# Patient Record
Sex: Male | Born: 1935 | Race: White | Hispanic: No | Smoking: Never smoker
Health system: Southern US, Community
[De-identification: ages and names within clinical notes are randomized; demographics above are authoritative.]

## PROBLEM LIST (undated history)

## (undated) DIAGNOSIS — E785 Hyperlipidemia, unspecified: Secondary | ICD-10-CM

## (undated) DIAGNOSIS — G709 Myoneural disorder, unspecified: Secondary | ICD-10-CM

## (undated) DIAGNOSIS — M1611 Unilateral primary osteoarthritis, right hip: Secondary | ICD-10-CM

## (undated) DIAGNOSIS — I10 Essential (primary) hypertension: Secondary | ICD-10-CM

---

## 2007-08-08 HISTORY — PX: JOINT REPLACEMENT: SHX530

## 2013-09-15 DIAGNOSIS — L819 Disorder of pigmentation, unspecified: Secondary | ICD-10-CM | POA: Diagnosis not present

## 2013-09-15 DIAGNOSIS — L570 Actinic keratosis: Secondary | ICD-10-CM | POA: Diagnosis not present

## 2013-09-15 DIAGNOSIS — L821 Other seborrheic keratosis: Secondary | ICD-10-CM | POA: Diagnosis not present

## 2013-09-15 DIAGNOSIS — I789 Disease of capillaries, unspecified: Secondary | ICD-10-CM | POA: Diagnosis not present

## 2014-06-01 DIAGNOSIS — E782 Mixed hyperlipidemia: Secondary | ICD-10-CM | POA: Diagnosis not present

## 2014-06-01 DIAGNOSIS — Z23 Encounter for immunization: Secondary | ICD-10-CM | POA: Diagnosis not present

## 2014-06-01 DIAGNOSIS — I10 Essential (primary) hypertension: Secondary | ICD-10-CM | POA: Diagnosis not present

## 2014-07-06 DIAGNOSIS — I10 Essential (primary) hypertension: Secondary | ICD-10-CM | POA: Diagnosis not present

## 2014-07-06 DIAGNOSIS — Z0000 Encounter for general adult medical examination without abnormal findings: Secondary | ICD-10-CM | POA: Diagnosis not present

## 2014-12-24 DIAGNOSIS — I10 Essential (primary) hypertension: Secondary | ICD-10-CM | POA: Diagnosis not present

## 2014-12-24 DIAGNOSIS — E782 Mixed hyperlipidemia: Secondary | ICD-10-CM | POA: Diagnosis not present

## 2015-07-05 DIAGNOSIS — D229 Melanocytic nevi, unspecified: Secondary | ICD-10-CM | POA: Diagnosis not present

## 2015-07-05 DIAGNOSIS — L309 Dermatitis, unspecified: Secondary | ICD-10-CM | POA: Diagnosis not present

## 2015-07-05 DIAGNOSIS — Z79899 Other long term (current) drug therapy: Secondary | ICD-10-CM | POA: Diagnosis not present

## 2015-07-05 DIAGNOSIS — I10 Essential (primary) hypertension: Secondary | ICD-10-CM | POA: Diagnosis not present

## 2015-07-05 DIAGNOSIS — Z23 Encounter for immunization: Secondary | ICD-10-CM | POA: Diagnosis not present

## 2015-07-05 DIAGNOSIS — E782 Mixed hyperlipidemia: Secondary | ICD-10-CM | POA: Diagnosis not present

## 2015-12-13 DIAGNOSIS — Z23 Encounter for immunization: Secondary | ICD-10-CM | POA: Diagnosis not present

## 2015-12-13 DIAGNOSIS — L309 Dermatitis, unspecified: Secondary | ICD-10-CM | POA: Diagnosis not present

## 2015-12-13 DIAGNOSIS — Z125 Encounter for screening for malignant neoplasm of prostate: Secondary | ICD-10-CM | POA: Diagnosis not present

## 2015-12-13 DIAGNOSIS — I10 Essential (primary) hypertension: Secondary | ICD-10-CM | POA: Diagnosis not present

## 2015-12-13 DIAGNOSIS — E782 Mixed hyperlipidemia: Secondary | ICD-10-CM | POA: Diagnosis not present

## 2015-12-13 DIAGNOSIS — Z0000 Encounter for general adult medical examination without abnormal findings: Secondary | ICD-10-CM | POA: Diagnosis not present

## 2015-12-13 DIAGNOSIS — Z1211 Encounter for screening for malignant neoplasm of colon: Secondary | ICD-10-CM | POA: Diagnosis not present

## 2016-06-26 DIAGNOSIS — Z79899 Other long term (current) drug therapy: Secondary | ICD-10-CM | POA: Diagnosis not present

## 2016-06-26 DIAGNOSIS — Z6832 Body mass index (BMI) 32.0-32.9, adult: Secondary | ICD-10-CM | POA: Diagnosis not present

## 2016-06-26 DIAGNOSIS — E782 Mixed hyperlipidemia: Secondary | ICD-10-CM | POA: Diagnosis not present

## 2016-06-26 DIAGNOSIS — K429 Umbilical hernia without obstruction or gangrene: Secondary | ICD-10-CM | POA: Diagnosis not present

## 2016-06-26 DIAGNOSIS — Z23 Encounter for immunization: Secondary | ICD-10-CM | POA: Diagnosis not present

## 2016-06-26 DIAGNOSIS — I10 Essential (primary) hypertension: Secondary | ICD-10-CM | POA: Diagnosis not present

## 2017-01-08 DIAGNOSIS — Z6832 Body mass index (BMI) 32.0-32.9, adult: Secondary | ICD-10-CM | POA: Diagnosis not present

## 2017-01-08 DIAGNOSIS — Z0000 Encounter for general adult medical examination without abnormal findings: Secondary | ICD-10-CM | POA: Diagnosis not present

## 2017-01-08 DIAGNOSIS — Z125 Encounter for screening for malignant neoplasm of prostate: Secondary | ICD-10-CM | POA: Diagnosis not present

## 2017-01-08 DIAGNOSIS — Z23 Encounter for immunization: Secondary | ICD-10-CM | POA: Diagnosis not present

## 2017-05-30 DIAGNOSIS — C4491 Basal cell carcinoma of skin, unspecified: Secondary | ICD-10-CM | POA: Diagnosis not present

## 2017-05-30 DIAGNOSIS — Z125 Encounter for screening for malignant neoplasm of prostate: Secondary | ICD-10-CM | POA: Diagnosis not present

## 2017-05-30 DIAGNOSIS — Z23 Encounter for immunization: Secondary | ICD-10-CM | POA: Diagnosis not present

## 2017-05-30 DIAGNOSIS — R609 Edema, unspecified: Secondary | ICD-10-CM | POA: Diagnosis not present

## 2017-05-30 DIAGNOSIS — I10 Essential (primary) hypertension: Secondary | ICD-10-CM | POA: Diagnosis not present

## 2017-05-30 DIAGNOSIS — Z79899 Other long term (current) drug therapy: Secondary | ICD-10-CM | POA: Diagnosis not present

## 2017-05-30 DIAGNOSIS — E782 Mixed hyperlipidemia: Secondary | ICD-10-CM | POA: Diagnosis not present

## 2017-07-02 DIAGNOSIS — M19049 Primary osteoarthritis, unspecified hand: Secondary | ICD-10-CM | POA: Diagnosis not present

## 2017-07-02 DIAGNOSIS — M5412 Radiculopathy, cervical region: Secondary | ICD-10-CM | POA: Diagnosis not present

## 2017-07-02 DIAGNOSIS — R609 Edema, unspecified: Secondary | ICD-10-CM | POA: Diagnosis not present

## 2017-07-03 ENCOUNTER — Other Ambulatory Visit: Payer: Self-pay | Admitting: Family Medicine

## 2017-07-03 ENCOUNTER — Ambulatory Visit
Admission: RE | Admit: 2017-07-03 | Discharge: 2017-07-03 | Disposition: A | Discharge status: Home / Self Care | Payer: Medicare Other | Source: Ambulatory Visit | Attending: Family Medicine | Admitting: Family Medicine

## 2017-07-03 DIAGNOSIS — M1990 Unspecified osteoarthritis, unspecified site: Secondary | ICD-10-CM

## 2017-07-03 DIAGNOSIS — M542 Cervicalgia: Secondary | ICD-10-CM | POA: Diagnosis not present

## 2017-07-03 DIAGNOSIS — M19041 Primary osteoarthritis, right hand: Secondary | ICD-10-CM | POA: Diagnosis not present

## 2017-07-03 DIAGNOSIS — M19042 Primary osteoarthritis, left hand: Secondary | ICD-10-CM | POA: Diagnosis not present

## 2017-07-09 DIAGNOSIS — Z23 Encounter for immunization: Secondary | ICD-10-CM | POA: Diagnosis not present

## 2017-07-09 DIAGNOSIS — L821 Other seborrheic keratosis: Secondary | ICD-10-CM | POA: Diagnosis not present

## 2017-07-09 DIAGNOSIS — L570 Actinic keratosis: Secondary | ICD-10-CM | POA: Diagnosis not present

## 2017-07-09 DIAGNOSIS — D485 Neoplasm of uncertain behavior of skin: Secondary | ICD-10-CM | POA: Diagnosis not present

## 2017-07-09 DIAGNOSIS — D1800 Hemangioma unspecified site: Secondary | ICD-10-CM | POA: Diagnosis not present

## 2017-07-13 ENCOUNTER — Ambulatory Visit
Admission: RE | Admit: 2017-07-13 | Discharge: 2017-07-13 | Disposition: A | Discharge status: Home / Self Care | Payer: Medicare Other | Source: Ambulatory Visit | Attending: Family Medicine | Admitting: Family Medicine

## 2017-07-13 DIAGNOSIS — M1990 Unspecified osteoarthritis, unspecified site: Secondary | ICD-10-CM

## 2017-07-13 DIAGNOSIS — M50221 Other cervical disc displacement at C4-C5 level: Secondary | ICD-10-CM | POA: Diagnosis not present

## 2017-07-13 DIAGNOSIS — M5023 Other cervical disc displacement, cervicothoracic region: Secondary | ICD-10-CM | POA: Diagnosis not present

## 2017-07-13 MED ORDER — GADOBENATE DIMEGLUMINE 529 MG/ML IV SOLN
20.0000 mL | Freq: Once | INTRAVENOUS | Status: AC | PRN
  Administered 2017-07-13: 20 mL via INTRAVENOUS

## 2017-07-17 DIAGNOSIS — M5412 Radiculopathy, cervical region: Secondary | ICD-10-CM | POA: Diagnosis not present

## 2017-07-17 DIAGNOSIS — E782 Mixed hyperlipidemia: Secondary | ICD-10-CM | POA: Diagnosis not present

## 2017-07-17 DIAGNOSIS — L309 Dermatitis, unspecified: Secondary | ICD-10-CM | POA: Diagnosis not present

## 2017-07-17 DIAGNOSIS — Z6832 Body mass index (BMI) 32.0-32.9, adult: Secondary | ICD-10-CM | POA: Diagnosis not present

## 2017-08-14 DIAGNOSIS — G5603 Carpal tunnel syndrome, bilateral upper limbs: Secondary | ICD-10-CM | POA: Diagnosis not present

## 2017-08-27 DIAGNOSIS — Z6828 Body mass index (BMI) 28.0-28.9, adult: Secondary | ICD-10-CM | POA: Diagnosis not present

## 2017-08-27 DIAGNOSIS — H6123 Impacted cerumen, bilateral: Secondary | ICD-10-CM | POA: Diagnosis not present

## 2017-08-27 DIAGNOSIS — E782 Mixed hyperlipidemia: Secondary | ICD-10-CM | POA: Diagnosis not present

## 2017-08-28 DIAGNOSIS — G5603 Carpal tunnel syndrome, bilateral upper limbs: Secondary | ICD-10-CM | POA: Diagnosis not present

## 2017-09-03 DIAGNOSIS — L817 Pigmented purpuric dermatosis: Secondary | ICD-10-CM | POA: Diagnosis not present

## 2017-09-03 DIAGNOSIS — L570 Actinic keratosis: Secondary | ICD-10-CM | POA: Diagnosis not present

## 2017-09-11 ENCOUNTER — Encounter
Admission: RE | Admit: 2017-09-11 | Discharge: 2017-09-11 | Disposition: A | Discharge status: Home / Self Care | Payer: Medicare Other | Source: Ambulatory Visit | Attending: Neurosurgery | Admitting: Neurosurgery

## 2017-09-11 ENCOUNTER — Other Ambulatory Visit: Payer: Self-pay

## 2017-09-11 ENCOUNTER — Ambulatory Visit
Admission: RE | Admit: 2017-09-11 | Discharge: 2017-09-11 | Disposition: A | Discharge status: Home / Self Care | Payer: Medicare Other | Source: Ambulatory Visit | Attending: Neurosurgery | Admitting: Neurosurgery

## 2017-09-11 DIAGNOSIS — I10 Essential (primary) hypertension: Secondary | ICD-10-CM | POA: Insufficient documentation

## 2017-09-11 DIAGNOSIS — Z01812 Encounter for preprocedural laboratory examination: Secondary | ICD-10-CM | POA: Insufficient documentation

## 2017-09-11 DIAGNOSIS — Z01818 Encounter for other preprocedural examination: Secondary | ICD-10-CM | POA: Diagnosis not present

## 2017-09-11 DIAGNOSIS — Z01810 Encounter for preprocedural cardiovascular examination: Secondary | ICD-10-CM | POA: Diagnosis not present

## 2017-09-11 DIAGNOSIS — Z419 Encounter for procedure for purposes other than remedying health state, unspecified: Secondary | ICD-10-CM

## 2017-09-11 HISTORY — DX: Myoneural disorder, unspecified: G70.9

## 2017-09-11 HISTORY — DX: Essential (primary) hypertension: I10

## 2017-09-11 HISTORY — DX: Hyperlipidemia, unspecified: E78.5

## 2017-09-11 LAB — CBC
HEMATOCRIT: 45 % (ref 40.0–52.0)
Hemoglobin: 15.2 g/dL (ref 13.0–18.0)
MCH: 31.7 pg (ref 26.0–34.0)
MCHC: 33.8 g/dL (ref 32.0–36.0)
MCV: 93.6 fL (ref 80.0–100.0)
Platelets: 162 10*3/uL (ref 150–440)
RBC: 4.8 MIL/uL (ref 4.40–5.90)
RDW: 13.5 % (ref 11.5–14.5)
WBC: 4.2 10*3/uL (ref 3.8–10.6)

## 2017-09-11 LAB — URINALYSIS, ROUTINE W REFLEX MICROSCOPIC
Bilirubin Urine: NEGATIVE
Glucose, UA: NEGATIVE mg/dL
HGB URINE DIPSTICK: NEGATIVE
Ketones, ur: NEGATIVE mg/dL
Leukocytes, UA: NEGATIVE
NITRITE: NEGATIVE
Protein, ur: NEGATIVE mg/dL
SPECIFIC GRAVITY, URINE: 1.017 (ref 1.005–1.030)
pH: 5 (ref 5.0–8.0)

## 2017-09-11 LAB — BASIC METABOLIC PANEL
Anion gap: 7 (ref 5–15)
BUN: 25 mg/dL — AB (ref 6–20)
CO2: 25 mmol/L (ref 22–32)
Calcium: 9.1 mg/dL (ref 8.9–10.3)
Chloride: 106 mmol/L (ref 101–111)
Creatinine, Ser: 0.85 mg/dL (ref 0.61–1.24)
GFR calc Af Amer: >60 mL/min (ref >60)
GLUCOSE: 122 mg/dL — AB (ref 65–99)
POTASSIUM: 3.9 mmol/L (ref 3.5–5.1)
Sodium: 138 mmol/L (ref 135–145)

## 2017-09-11 LAB — DIFFERENTIAL
BASOS PCT: 1 %
Basophils Absolute: 0 10*3/uL (ref 0–0.1)
EOS PCT: 3 %
Eosinophils Absolute: 0.1 10*3/uL (ref 0–0.7)
Lymphocytes Relative: 29 %
Lymphs Abs: 1.2 10*3/uL (ref 1.0–3.6)
MONO ABS: 0.6 10*3/uL (ref 0.2–1.0)
MONOS PCT: 15 %
NEUTROS ABS: 2.2 10*3/uL (ref 1.4–6.5)
Neutrophils Relative %: 52 %

## 2017-09-11 LAB — PROTIME-INR
INR: 1.04
Prothrombin Time: 13.5 seconds (ref 11.4–15.2)

## 2017-09-11 LAB — APTT: aPTT: 30 seconds (ref 24–36)

## 2017-09-11 NOTE — Patient Instructions (Signed)
Your procedure is scheduled on: Monday 3/35/19 Report to Day Surgery. To find out your arrival time please call [phone] between 1PM - 3PM on Friday. 10/26/17  Remember: Instructions that are not followed completely may result in serious medical risk, up to and including death, or upon the discretion of your surgeon and anesthesiologist your surgery may need to be rescheduled.     _X__ 1. Do not eat food after midnight the night before your procedure.                 No gum chewing or hard candies. You may drink clear liquids up to 2 hours                 before you are scheduled to arrive for your surgery- DO not drink clear                 liquids within 2 hours of the start of your surgery.                 Clear Liquids include:  water, apple juice without pulp, clear carbohydrate                 drink such as Clearfast of Gartorade, Black Coffee or Tea (Do not add                 anything to coffee or tea).  __X__2.  On the morning of surgery brush your teeth with toothpaste and water, you  may rinse your mouth with mouthwash if you wish.  Do not swallow any toothpaste of mouthwash.     _X__ 3.  No Alcohol for 24 hours before or after surgery.   _X__ 4.  Do Not Smoke or use e-cigarettes For 24 Hours Prior to Your Surgery.                 Do not use any chewable tobacco products for at least 6 hours prior to                 surgery.  ____  5.  Bring all medications with you on the day of surgery if instructed.   ____  6.  Notify your doctor if there is any change in your medical condition      (cold, fever, infections).     Do not wear jewelry, make-up, hairpins, clips or nail polish. Do not wear lotions, powders, or perfumes. You may wear deodorant. Do not shave 48 hours prior to surgery. Men may shave face and neck. Do not bring valuables to the hospital.    [facility] is not responsible for any belongings or valuables.  Contacts, dentures or  bridgework may not be worn into surgery. Leave your suitcase in the car. After surgery it may be brought to your room. For patients admitted to the hospital, discharge time is determined by your treatment team.   Patients discharged the day of surgery will not be allowed to drive home.   Please read over the following fact sheets that you were given:    _x___ Take these medicines the morning of surgery with A SIP OF WATER:    1.amLODipine (NORVASC) 10 MG tablet  2. atorvastatin (LIPITOR) 20 MG tablet  3.   4.  5.  6.  ____ Fleet Enema (as directed)   x____ Use CHG Soap as directed  ____ Use inhalers on the day of surgery  ____ Stop metformin 2 days prior to  surgery    ____ Take 1/2 of usual insulin dose the night before surgery. No insulin the morning          of surgery.   __x__ Stop aspirin on 10/22/17   __x__ Stop Anti-inflammatories on naproxen sodium (ALEVE) 220 MG tablet, diclofenac sodium (VOLTAREN) 1 % GEL on 10/22/17   __x__ Stop supplements until after surgery.  Everflex, Coryza forte otc supplement, Fenshi otc supplement on 10/22/17  ____ Bring C-Pap to the hospital.

## 2017-09-11 NOTE — Patient Instructions (Signed)
Your procedure is scheduled QM:GQQPYP 09/17/17 Report to Day Surgery. To find out your arrival time please call [phone] between 1PM - 3PM on Friday.09/24/17  Remember: Instructions that are not followed completely may result in serious medical risk, up to and including death, or upon the discretion of your surgeon and anesthesiologist your surgery may need to be rescheduled.     _X__ 1. Do not eat food after midnight the night before your procedure.                 No gum chewing or hard candies. You may drink clear liquids up to 2 hours                 before you are scheduled to arrive for your surgery- DO not drink clear                 liquids within 2 hours of the start of your surgery.                 Clear Liquids include:  water, apple juice without pulp, clear carbohydrate                 drink such as Clearfast of Gartorade, Black Coffee or Tea (Do not add                 anything to coffee or tea).  __X__2.  On the morning of surgery brush your teeth with toothpaste and water, you  may rinse your mouth with mouthwash if you wish.  Do not swallow any              toothpaste of mouthwash.     _X__ 3.  No Alcohol for 24 hours before or after surgery.   ___ 4.  Do Not Smoke or use e-cigarettes For 24 Hours Prior to Your Surgery.                 Do not use any chewable tobacco products for at least 6 hours prior to                 surgery.  ____  5.  Bring all medications with you on the day of surgery if instructed.   _x___  6.  Notify your doctor if there is any change in your medical condition      (cold, fever, infections).     Do not wear jewelry, make-up, hairpins, clips or nail polish. Do not wear lotions, powders, or perfumes. You may wear deodorant. Do not shave 48 hours prior to surgery. Men may shave face and neck. Do not bring valuables to the hospital.    [facility] is not responsible for any belongings or valuables.  Contacts,  dentures or bridgework may not be worn into surgery. Leave your suitcase in the car. After surgery it may be brought to your room. For patients admitted to the hospital, discharge time is determined by your treatment team.   Patients discharged the day of surgery will not be allowed to drive home.   Please read over the following fact sheets that you were given:    _x___ Take these medicines the morning of surgery with A SIP OF WATER:    1. amLODipine (NORVASC) 10 MG tablet  2. atorvastatin (LIPITOR) 20 MG tablet  3.   4.  5.  6.  ____ Fleet Enema (as directed)   __x__ Use CHG Soap as directed  ____ Use inhalers on  the day of surgery  ____ Stop metformin 2 days prior to surgery    ____ Take 1/2 of usual insulin dose the night before surgery. No insulin the morning          of surgery.   __x__ Stopped aspirin on 2/3  __x__ Stop Anti-inflammatories on on 2/3  diclofenac sodium (VOLTAREN) 1 % GEL,naproxen sodium (ALEVE) 220 MG tablet   __x__ Stop supplements until after surgery.  everflex,Coryza ,Fenshi otc supplement  ____ Bring C-Pap to the hospital.

## 2017-09-17 ENCOUNTER — Ambulatory Visit: Payer: Medicare Other | Admitting: Anesthesiology

## 2017-09-17 ENCOUNTER — Encounter
Admission: RE | Disposition: A | Discharge status: Home / Self Care | Payer: Self-pay | Source: Ambulatory Visit | Attending: Neurosurgery

## 2017-09-17 ENCOUNTER — Ambulatory Visit
Admission: RE | Admit: 2017-09-17 | Discharge: 2017-09-17 | Disposition: A | Discharge status: Home / Self Care | Payer: Medicare Other | Source: Ambulatory Visit | Attending: Neurosurgery | Admitting: Neurosurgery

## 2017-09-17 DIAGNOSIS — Z7982 Long term (current) use of aspirin: Secondary | ICD-10-CM | POA: Diagnosis not present

## 2017-09-17 DIAGNOSIS — Z9660 Presence of unspecified orthopedic joint implant: Secondary | ICD-10-CM | POA: Diagnosis not present

## 2017-09-17 DIAGNOSIS — Z79899 Other long term (current) drug therapy: Secondary | ICD-10-CM | POA: Diagnosis not present

## 2017-09-17 DIAGNOSIS — G5603 Carpal tunnel syndrome, bilateral upper limbs: Secondary | ICD-10-CM | POA: Insufficient documentation

## 2017-09-17 DIAGNOSIS — R200 Anesthesia of skin: Secondary | ICD-10-CM | POA: Diagnosis present

## 2017-09-17 DIAGNOSIS — I10 Essential (primary) hypertension: Secondary | ICD-10-CM | POA: Diagnosis not present

## 2017-09-17 DIAGNOSIS — E785 Hyperlipidemia, unspecified: Secondary | ICD-10-CM | POA: Diagnosis not present

## 2017-09-17 DIAGNOSIS — G5601 Carpal tunnel syndrome, right upper limb: Secondary | ICD-10-CM | POA: Diagnosis not present

## 2017-09-17 HISTORY — PX: CARPAL TUNNEL RELEASE: SHX101

## 2017-09-17 SURGERY — CARPAL TUNNEL RELEASE
Anesthesia: Monitor Anesthesia Care | Site: Wrist | Laterality: Right | Wound class: Clean

## 2017-09-17 MED ORDER — PROPOFOL 10 MG/ML IV BOLUS
INTRAVENOUS | Status: AC
  Filled 2017-09-17: qty 20

## 2017-09-17 MED ORDER — FENTANYL CITRATE (PF) 100 MCG/2ML IJ SOLN: 25.0000 ug | INTRAMUSCULAR | Status: DC | PRN

## 2017-09-17 MED ORDER — LIDOCAINE HCL (CARDIAC) 20 MG/ML IV SOLN
INTRAVENOUS | Status: DC | PRN
  Administered 2017-09-17: 100 mg via INTRAVENOUS

## 2017-09-17 MED ORDER — FENTANYL CITRATE (PF) 100 MCG/2ML IJ SOLN
INTRAMUSCULAR | Status: AC
  Filled 2017-09-17: qty 2

## 2017-09-17 MED ORDER — PROPOFOL 500 MG/50ML IV EMUL
INTRAVENOUS | Status: DC | PRN
  Administered 2017-09-17: 50 ug/kg/min via INTRAVENOUS

## 2017-09-17 MED ORDER — FAMOTIDINE 20 MG PO TABS
20.0000 mg | ORAL_TABLET | Freq: Once | ORAL | Status: AC
  Administered 2017-09-17: 20 mg via ORAL

## 2017-09-17 MED ORDER — FAMOTIDINE 20 MG PO TABS
ORAL_TABLET | ORAL | Status: AC
  Filled 2017-09-17: qty 1

## 2017-09-17 MED ORDER — LACTATED RINGERS IV SOLN
INTRAVENOUS | Status: DC
  Administered 2017-09-17: 11:00:00 via INTRAVENOUS

## 2017-09-17 MED ORDER — LIDOCAINE-EPINEPHRINE 1 %-1:100000 IJ SOLN
INTRAMUSCULAR | Status: AC
  Filled 2017-09-17: qty 1

## 2017-09-17 MED ORDER — BACITRACIN 50000 UNITS IM SOLR
INTRAMUSCULAR | Status: AC
  Filled 2017-09-17: qty 1

## 2017-09-17 MED ORDER — SODIUM CHLORIDE 0.9 % IR SOLN
Status: DC | PRN
  Administered 2017-09-17: 1000 mL

## 2017-09-17 MED ORDER — CEFAZOLIN SODIUM-DEXTROSE 2-3 GM-%(50ML) IV SOLR
INTRAVENOUS | Status: DC | PRN
  Administered 2017-09-17: 2 g via INTRAVENOUS

## 2017-09-17 MED ORDER — LIDOCAINE-EPINEPHRINE 1 %-1:100000 IJ SOLN
INTRAMUSCULAR | Status: DC | PRN
  Administered 2017-09-17: 10 mL

## 2017-09-17 MED ORDER — ONDANSETRON HCL 4 MG/2ML IJ SOLN: 4.0000 mg | Freq: Once | INTRAMUSCULAR | Status: DC | PRN

## 2017-09-17 MED ORDER — FENTANYL CITRATE (PF) 100 MCG/2ML IJ SOLN
INTRAMUSCULAR | Status: DC | PRN
  Administered 2017-09-17: 50 ug via INTRAVENOUS

## 2017-09-17 SURGICAL SUPPLY — 32 items
BNDG GAUZE 4.5X4.1 6PLY STRL (MISCELLANEOUS) ×2 IMPLANT
CANISTER SUCT 1200ML W/VALVE (MISCELLANEOUS) ×2 IMPLANT
CHLORAPREP W/TINT 26ML (MISCELLANEOUS) ×4 IMPLANT
CORD BIP STRL DISP 12FT (MISCELLANEOUS) ×2 IMPLANT
DERMABOND ADVANCED (GAUZE/BANDAGES/DRESSINGS) ×1
DERMABOND ADVANCED .7 DNX12 (GAUZE/BANDAGES/DRESSINGS) ×1 IMPLANT
DRSG TELFA 3X8 NADH (GAUZE/BANDAGES/DRESSINGS) ×2 IMPLANT
ELECT CAUTERY BLADE TIP 2.5 (TIP) ×2
ELECTRODE CAUTERY BLDE TIP 2.5 (TIP) ×1 IMPLANT
FORCEPS JEWEL BIP 4-3/4 STR (INSTRUMENTS) ×2 IMPLANT
GAUZE FLUFF 18X24 1PLY STRL (GAUZE/BANDAGES/DRESSINGS) ×2 IMPLANT
GAUZE SPONGE 4X4 12PLY STRL (GAUZE/BANDAGES/DRESSINGS) ×4 IMPLANT
GAUZE XEROFORM 4X4 STRL (GAUZE/BANDAGES/DRESSINGS) ×2 IMPLANT
GLOVE BIOGEL PI IND STRL 8 (GLOVE) ×1 IMPLANT
GLOVE BIOGEL PI INDICATOR 8 (GLOVE) ×1
GLOVE SURG SYN 8.0 (GLOVE) ×2 IMPLANT
GOWN STRL REUS W/ TWL LRG LVL3 (GOWN DISPOSABLE) ×2 IMPLANT
GOWN STRL REUS W/ TWL XL LVL3 (GOWN DISPOSABLE) ×1 IMPLANT
GOWN STRL REUS W/TWL LRG LVL3 (GOWN DISPOSABLE) ×2
GOWN STRL REUS W/TWL XL LVL3 (GOWN DISPOSABLE) ×1
KIT TURNOVER KIT A (KITS) ×2 IMPLANT
NS IRRIG 1000ML POUR BTL (IV SOLUTION) ×2 IMPLANT
PACK EXTREMITY ARMC (MISCELLANEOUS) ×2 IMPLANT
SOL PREP PVP 2OZ (MISCELLANEOUS) ×2
SOLUTION PREP PVP 2OZ (MISCELLANEOUS) ×1 IMPLANT
STOCKINETTE STRL 4IN 9604848 (GAUZE/BANDAGES/DRESSINGS) ×2 IMPLANT
SUT ETHILON 3-0 FS-10 30 BLK (SUTURE) ×2
SUT VIC AB 2-0 SH 27 (SUTURE) ×3
SUT VIC AB 2-0 SH 27XBRD (SUTURE) ×3 IMPLANT
SUT VIC AB 3-0 SH 27 (SUTURE) ×1
SUT VIC AB 3-0 SH 27X BRD (SUTURE) ×1 IMPLANT
SUTURE EHLN 3-0 FS-10 30 BLK (SUTURE) ×1 IMPLANT

## 2017-09-17 NOTE — Anesthesia Preprocedure Evaluation (Signed)
Anesthesia Evaluation  Patient identified by MRN, date of birth, ID band Patient awake    Reviewed: Allergy & Precautions, NPO status , Patient's Chart, lab work & pertinent test results  Airway Mallampati: II  TM Distance: >3 FB     Dental   Pulmonary neg pulmonary ROS,    Pulmonary exam normal        Cardiovascular hypertension, Pt. on medications Normal cardiovascular exam     Neuro/Psych  Neuromuscular disease negative psych ROS   GI/Hepatic negative GI ROS, Neg liver ROS,   Endo/Other  negative endocrine ROS  Renal/GU negative Renal ROS  negative genitourinary   Musculoskeletal negative musculoskeletal ROS (+)   Abdominal Normal abdominal exam  (+)   Peds negative pediatric ROS (+)  Hematology negative hematology ROS (+)   Anesthesia Other Findings   Reproductive/Obstetrics                             Anesthesia Physical Anesthesia Plan  ASA: II  Anesthesia Plan: MAC and General   Post-op Pain Management:    Induction: Intravenous  PONV Risk Score and Plan:   Airway Management Planned: Nasal Cannula  Additional Equipment:   Intra-op Plan:   Post-operative Plan:   Informed Consent: I have reviewed the patients History and Physical, chart, labs and discussed the procedure including the risks, benefits and alternatives for the proposed anesthesia with the patient or authorized representative who has indicated his/her understanding and acceptance.   Dental advisory given  Plan Discussed with: CRNA and Surgeon  Anesthesia Plan Comments:         Anesthesia Quick Evaluation

## 2017-09-17 NOTE — Op Note (Signed)
SURGERY DATE: 09/17/2017  PRE-OP DIAGNOSIS: Carpal tunnel syndrome of right wrist [G56.01]   POST-OP DIAGNOSIS:Post-Op Diagnosis Codes: * Carpal tunnel syndrome of right wrist [G56.01]  Procedure(s): NEUROPLASTY AND/OR TRANSPOSITION; MEDIAN NERVE AT CARPAL TUNNEL (Right)  SURGEON: Surgeon(s) and Role: Malen Gauze, MD - Primary       Marin Olp, [place] - Assisting  ANESTHESIA:Monitor Anesthesia Care   OPERATIVE FINDINGS:Compressive ligament at right wrist  OPERATIVE REPORT: Indication Cole Barker was seen in clinic on 1/8 and found to have ongoing bilateralhand numbness and pain. An EMG/ NCS showed bilateral carpal tunnel syndrome. This was interfering with his daily lifestyle. He had failed conservative management including medication. Surgery for decompression of the nerve was discussed.The risks of surgery including numbness and weakness in hand, pain, infection, and hematoma were discussed. He decided to proceed with the right hand first.   Procedure The patient was transferred to the operating room. The patient was given preoperative prophylactic IV antibiotics. For anesthesia, IV conscious sedation was delivered by the anesthesia service. The patient was positioned supine, positioned with therightarm extended resting on an armboard. All pressure points were carefully padded. The planned incision was demarcated based at the wrist at the volar crease of the righthand and the interspace between the third and fourth digit. A TIME OUT was performed  The entireright arm was prepped and draped in standard sterile technique. An approximately 4-cm straight incision was performed in the proximal palm after infiltration with local anesthetic. The incision extended from distal wrist crease toward space between digits 3 and 4. It was extended down to the palmar fascia to the level of the flexor retinaculum. Subsequently, the distal transverse carpal ligament was  incised and the perineural fat exposed. Neuroplasty was carried out both proximally and distally, incising the entire transverse carpal ligament to release the entire median nerve. The median nerve was preserved intact throughout the entire procedure. The nerve was seen to be mildly widened due to the compression. A blunt dissector was passed proximally and distally to ensure that the entire length of the flexor retinaculum had been incised and the nerve decompressed. There were no other points of nerve compression.   The wound was irrigated with antibiotic saline solution until clear and hemostasis was meticulously achieved with bipolar electrocautery. The subdermis was closed with 2-0 vicryl. The skin was closed with a interrupted 3-0 nylon with vertical mattress sutures. The incision was dressed in a clean dry sterile dressing. All sponge counts, needle counts, and instrument counts were correct at the end of the case x 2. All pressure points remained padded throughout the entire case. The patient tolerated the procedure well without any complications and was transferred in stable condition to the PACU.    ESTIMATED BLOOD LOSS:5 cc   SPECIMENS: None  IMPLANT None  ATTESTATION  I performed the case in its entiretywith assistance of PA, Corrie Mckusick, [place]

## 2017-09-17 NOTE — Transfer of Care (Signed)
Immediate Anesthesia Transfer of Care Note  Patient: Cole Barker  Procedure(s) Performed: CARPAL TUNNEL RELEASE (Right Wrist)  Patient Location: PACU  Anesthesia Type:MAC  Level of Consciousness: awake, alert  and oriented  Airway & Oxygen Therapy: Patient Spontanous Breathing  Post-op Assessment: Report given to RN  Post vital signs: Reviewed and stable  Last Vitals:  Vitals:   09/17/17 1044  BP: (!) 146/66  Pulse: 80  Resp: 16  Temp: 36.4 C  SpO2: 98%    Last Pain:  Vitals:   09/17/17 1044  TempSrc: Oral         Complications: No apparent anesthesia complications

## 2017-09-17 NOTE — OR Nursing (Signed)
Per guest with patient, MD advises pt to take ibuprofen and/or tylenol if needed and to call MD if other pain med needed.  No RX written today.

## 2017-09-17 NOTE — Discharge Summary (Signed)
  History: Kalum Minner is here for right median nerve decompression for carpal tunnel syndrome. Tolerated procedure well without complications. Patient seen in recovery. Denies new pain/numbness/tingling, but still experiencing numbness in first four digits on right hand that was present prior to surgery.   Physical Exam: Vitals:   09/17/17 1044 09/17/17 1456  BP: (!) 146/66 (!) 141/72  Pulse: 80 84  Resp: 16 (!) 21  Temp: 97.6 F (36.4 C) (!) 97.3 F (36.3 C)  SpO2: 98% 98%   AA Ox3 CNI Skin: Incision site dressed.   Assessment/Plan:  Quadry Kampa is here for right median nerve decompression for carpal tunnel syndrome. Tolrated procedure well. Denies new symptom presentation and is able to move all digits on right hand. Pain will be controlled with OTC ibuprofen/Tylenol. Post op instructions provided verbally and written. Patient advised to contact office if any questions or concerns arise. He will follow up in 2 weeks in clinic for suture removal and to monitor progress.   Marin Olp PA-C Department of Neurosurgery

## 2017-09-17 NOTE — Discharge Instructions (Addendum)
NEUROSURGERY DISCHARGE INSTRUCTIONS  Admission Diagnosis: Carpal Tunnel Syndrome  Discharge Diagnosis: Carpal Tunnel Syndrome  Operative procedure: Median nerve decompression   The following are instructions to help in your recovery once you have been discharged from the hospital. Even if you feel well, it is important that you follow these activity guidelines. If you do not let your hand heal properly from the surgery, you can increase the chance of return of your symptoms and other complications.   What to do after you leave the hospital:  Recommended diet:  Increase protein intake to promote wound healing. You may return to your usual diet. Be sure to stay hydrated.   Recommended activity:  Engage in finger movement after surgery but avoid bending and twisting the wrist for 3 days. After 3 days, begin wrist exercises as instructed.    If you smoke, we strongly recommend that you quit. Smoking has been proven to interfere with normal bone healing and will dramatically reduce the success rate of your surgery. Please contact [facility] (800-QUIT-NOW) and use the resources at www.[facility].com for assistance in stopping smoking.   Medications  Do not restart Aspirin until seven days after surgery  Take Tylenol and/or Ibuprofen for pain as needed.   You may restart home medications.  Wound Care Instructions  If you have a dressing on your incision, remove it THREE days after your surgery. Keep your incision area clean and dry. You may shower after the bandage has been removed but do not soak or submerge in water.   If you have staples or stitches on your incision, you should have a follow up scheduled for removal. If you do not have staples or stitches, you will have steri-strips (small pieces of surgical tape) or Dermabond glue. The steri-strips/glue should begin to peel away within about a week (it is fine if the steri-strips fall off before then). If the strips are still in place one  week after your surgery, you may gently remove them.    Please Report any of the following: You may experience pain in your neck and/or pain between your shoulder blades. This is normal and should improve in the next few weeks with the help of pain medication, muscle relaxers, and rest. Some patients report that a warm compress on the back of the neck or between the shoulder blades helps.   However, should you experience any of the following, contact us immediately:   New numbness or weakness   Pain that is progressively getting worse, and is not relieved by your pain medication, muscle relaxers, rest, and warm compresses   Bleeding, redness, swelling, pain, or drainage from surgical incision   Chills or flu-like symptoms   Fever greater than 101.0 F (38.3 C)   Inability to eat, drink fluids, or take medications   Problems with bowel or bladder functions   Difficulty breathing or shortness of breath   Warmth, tenderness, or swelling in your calf    Additional Follow up appointments During office hours (Monday-Friday 9 am to 5 pm), please call your physician at [phone]  After hours and weekends, please call the [facility] Operator at  [phone] and ask for the Neurosurgeon On Call   For a life-threatening emergency, call 911    Please see below for scheduled appointments:    AMBULATORY SURGERY  DISCHARGE INSTRUCTIONS   1) The drugs that you were given will stay in your system until tomorrow so for the next 24 hours you should not:  A) Drive  an automobile B) Make any legal decisions C) Drink any alcoholic beverage   2) You may resume regular meals tomorrow.  Today it is better to start with liquids and gradually work up to solid foods.  You may eat anything you prefer, but it is better to start with liquids, then soup and crackers, and gradually work up to solid foods.   3) Please notify your doctor immediately if you have any unusual bleeding,  trouble breathing, redness and pain at the surgery site, drainage, fever, or pain not relieved by medication.    4) Additional Instructions:        Please contact your physician with any problems or Same Day Surgery at [phone], Monday through Friday 6 am to 4 pm, or  [facility] number at [phone].

## 2017-09-17 NOTE — Anesthesia Post-op Follow-up Note (Signed)
Anesthesia QCDR form completed.        

## 2017-09-17 NOTE — H&P (Signed)
Cole Barker is an 82 y.o. male.   Chief Complaint: Bilateral hand numbness and pain HPI: Cole Barker is here for evaluation of ongoing bilateral hand numbness and weakness. He states this started a couple months ago. He does not remember an inciting event. He denies any pain radiating down from his neck into his arms. He will noticed that he is unable to hold onto small objects in paper. He does feel that the left hand is worse than the right. He is experiencing numbness in the first 3 digits of bilateral hands, left greater than right. He has been placed on a couple of steroid tapers by his PCP and he says that the symptoms resolved. He has not had any procedures performed on his neck. He denies any symptoms similar to this in the past. He denies any difficulty with gait or balance.   He had an EMG confirming bilateral CTS and is here for decompression.     Past Medical History:  Diagnosis Date  . Hyperlipidemia   . Hypertension   . Neuromuscular disorder ([place])    neuropathy/corpal tunnel    Past Surgical History:  Procedure Laterality Date  . JOINT REPLACEMENT  2009   left    History reviewed. No pertinent family history. Social History:  reports that  has never smoked. he has never used smokeless tobacco. He reports that he drinks alcohol. He reports that he does not use drugs.  Allergies:  Allergies  Allergen Reactions  . Adhesive [Tape]     Burns, tears up skin    Medications Prior to Admission  Medication Sig Dispense Refill  . amLODipine (NORVASC) 10 MG tablet Take 10 mg by mouth daily.  3  . atorvastatin (LIPITOR) 20 MG tablet Take 10 mg by mouth daily.    . cetirizine (ZYRTEC) 10 MG tablet Take 10 mg by mouth daily.    . CHROMIUM GTF PO Take 1 tablet by mouth daily.    . diclofenac sodium (VOLTAREN) 1 % GEL Apply 1 application topically daily as needed (pain).    . folic acid (FOLVITE) 272 MCG tablet Take 800 mcg by mouth daily.    Marland Kitchen lisinopril (PRINIVIL,ZESTRIL) 5  MG tablet Take 5 mg by mouth daily.    . naproxen sodium (ALEVE) 220 MG tablet Take 220 mg by mouth daily as needed (pain).    Marland Kitchen OVER THE COUNTER MEDICATION Take 2 tablets by mouth daily. everflex    . OVER THE COUNTER MEDICATION Take 1 tablet by mouth daily. Coryza forte otc supplement    . OVER THE COUNTER MEDICATION Take 2 tablets by mouth daily. Fenshi otc supplement    . Potassium 99 MG TABS Take 99 mg by mouth daily.    Marland Kitchen triamcinolone cream (KENALOG) 0.1 % Apply 1 application topically 2 (two) times daily. For 14 days  0  . Vitamins A & D 10000-400 units TABS Take 1 tablet by mouth daily.    Marland Kitchen aspirin EC 81 MG tablet Take 81 mg by mouth daily.      No results found for this or any previous visit (from the past 48 hour(s)). No results found.  ROS General ROS: Negative Psychological ROS: Negative Ophthalmic ROS: Negative ENT ROS: Negative Hematological and Lymphatic ROS: Negative  Endocrine ROS: Negative Respiratory ROS: Negative Cardiovascular ROS: Negative Gastrointestinal ROS: Negative Genito-Urinary ROS: Negative Musculoskeletal ROS: Negative for neck pain Neurological ROS: Positive for hand numbness and weakness Dermatological ROS: Negative  Blood pressure (!) 146/66, pulse 80, temperature  97.6 F (36.4 C), temperature source Oral, resp. rate 16, SpO2 98 %. Physical Exam  General appearance: Alert, cooperative, in no acute distress Head: Normocephalic, atraumatic Eyes: Normal, EOM intact Oropharynx: Moist without lesions Neck: Supple, good range of motion Heart: Normal, regular rate and rhythm, without murmur Lungs: Clear to auscultation, good air exchange Ext: No edema in LE bilaterally, warm extremities  Neurologic exam:  Mental status: alertness: alert, affect: normal Speech: fluent and clear Cranial nerves:  VII:no evidence of facial droop or weakness XI: trapezius strength symmetric Motor: 5 out of 5 strength in bilateral deltoid, bicep, tricep, wrist  extension, APB, interossei Sensory: Decreased light touch over the lateral hand bilaterally, left greater than right. Numbness extends into the first 3-4 digits Reflexes: 2+ and symmetric bilaterally for biceps and brachioradialis, 2+ at bilateral patella Gait: normal     Impression: Abnormal study. There is electrodiagnostic evidence of chronic, bilateral, severe carpal tunnel syndrome.    Assessment/Plan Patient with bilateral carpal tunnel syndrome, plan for right sided decompression  [name], MD 09/17/2017, 1:16 PM

## 2017-09-18 ENCOUNTER — Encounter: Payer: Self-pay | Admitting: Neurosurgery

## 2017-09-18 NOTE — Anesthesia Postprocedure Evaluation (Signed)
Anesthesia Post Note  Patient: Cole Barker  Procedure(s) Performed: CARPAL TUNNEL RELEASE (Right Wrist)  Patient location during evaluation: PACU Anesthesia Type: General Level of consciousness: awake and alert and oriented Pain management: pain level controlled Vital Signs Assessment: post-procedure vital signs reviewed and stable Respiratory status: spontaneous breathing Cardiovascular status: blood pressure returned to baseline Anesthetic complications: no     Last Vitals:  Vitals:   09/17/17 1533 09/17/17 1559  BP: (!) 150/65 (!) 144/56  Pulse: 88 86  Resp: 12 14  Temp: (!) 36.3 C 36.6 C  SpO2: 96% 97%    Last Pain:  Vitals:   09/17/17 1559  TempSrc: Temporal  PainSc:                  Cole Barker

## 2017-10-04 DIAGNOSIS — G5601 Carpal tunnel syndrome, right upper limb: Secondary | ICD-10-CM | POA: Diagnosis not present

## 2017-10-04 DIAGNOSIS — Z9889 Other specified postprocedural states: Secondary | ICD-10-CM | POA: Diagnosis not present

## 2017-10-08 DIAGNOSIS — Z9889 Other specified postprocedural states: Secondary | ICD-10-CM | POA: Diagnosis not present

## 2017-10-08 DIAGNOSIS — G5601 Carpal tunnel syndrome, right upper limb: Secondary | ICD-10-CM | POA: Diagnosis not present

## 2017-10-10 ENCOUNTER — Ambulatory Visit: Payer: Medicare Other | Admitting: Occupational Therapy

## 2017-10-10 DIAGNOSIS — G5601 Carpal tunnel syndrome, right upper limb: Secondary | ICD-10-CM | POA: Diagnosis not present

## 2017-10-10 DIAGNOSIS — Z9889 Other specified postprocedural states: Secondary | ICD-10-CM | POA: Diagnosis not present

## 2017-10-11 DIAGNOSIS — G5601 Carpal tunnel syndrome, right upper limb: Secondary | ICD-10-CM | POA: Diagnosis not present

## 2017-10-11 DIAGNOSIS — Z9889 Other specified postprocedural states: Secondary | ICD-10-CM | POA: Diagnosis not present

## 2017-10-15 DIAGNOSIS — Z9889 Other specified postprocedural states: Secondary | ICD-10-CM | POA: Diagnosis not present

## 2017-10-15 DIAGNOSIS — G5601 Carpal tunnel syndrome, right upper limb: Secondary | ICD-10-CM | POA: Diagnosis not present

## 2017-10-18 DIAGNOSIS — Z9889 Other specified postprocedural states: Secondary | ICD-10-CM | POA: Diagnosis not present

## 2017-10-18 DIAGNOSIS — G5601 Carpal tunnel syndrome, right upper limb: Secondary | ICD-10-CM | POA: Diagnosis not present

## 2017-10-19 DIAGNOSIS — Z9889 Other specified postprocedural states: Secondary | ICD-10-CM | POA: Diagnosis not present

## 2017-10-19 DIAGNOSIS — G5601 Carpal tunnel syndrome, right upper limb: Secondary | ICD-10-CM | POA: Diagnosis not present

## 2017-10-22 DIAGNOSIS — G5601 Carpal tunnel syndrome, right upper limb: Secondary | ICD-10-CM | POA: Diagnosis not present

## 2017-10-22 DIAGNOSIS — Z9889 Other specified postprocedural states: Secondary | ICD-10-CM | POA: Diagnosis not present

## 2017-10-25 DIAGNOSIS — Z9889 Other specified postprocedural states: Secondary | ICD-10-CM | POA: Diagnosis not present

## 2017-10-25 DIAGNOSIS — G5601 Carpal tunnel syndrome, right upper limb: Secondary | ICD-10-CM | POA: Diagnosis not present

## 2017-10-26 DIAGNOSIS — Z9889 Other specified postprocedural states: Secondary | ICD-10-CM | POA: Diagnosis not present

## 2017-10-26 DIAGNOSIS — G5601 Carpal tunnel syndrome, right upper limb: Secondary | ICD-10-CM | POA: Diagnosis not present

## 2017-10-29 ENCOUNTER — Ambulatory Visit: Payer: Medicare Other | Admitting: Anesthesiology

## 2017-10-29 ENCOUNTER — Encounter
Admission: RE | Disposition: A | Discharge status: Home / Self Care | Payer: Self-pay | Source: Ambulatory Visit | Attending: Neurosurgery

## 2017-10-29 ENCOUNTER — Ambulatory Visit
Admission: RE | Admit: 2017-10-29 | Discharge: 2017-10-29 | Disposition: A | Discharge status: Home / Self Care | Payer: Medicare Other | Source: Ambulatory Visit | Attending: Neurosurgery | Admitting: Neurosurgery

## 2017-10-29 DIAGNOSIS — G5603 Carpal tunnel syndrome, bilateral upper limbs: Secondary | ICD-10-CM | POA: Diagnosis not present

## 2017-10-29 DIAGNOSIS — R200 Anesthesia of skin: Secondary | ICD-10-CM | POA: Diagnosis present

## 2017-10-29 DIAGNOSIS — I10 Essential (primary) hypertension: Secondary | ICD-10-CM | POA: Insufficient documentation

## 2017-10-29 DIAGNOSIS — G5602 Carpal tunnel syndrome, left upper limb: Secondary | ICD-10-CM | POA: Diagnosis not present

## 2017-10-29 DIAGNOSIS — E785 Hyperlipidemia, unspecified: Secondary | ICD-10-CM | POA: Insufficient documentation

## 2017-10-29 DIAGNOSIS — Z79899 Other long term (current) drug therapy: Secondary | ICD-10-CM | POA: Diagnosis not present

## 2017-10-29 DIAGNOSIS — Z9660 Presence of unspecified orthopedic joint implant: Secondary | ICD-10-CM | POA: Insufficient documentation

## 2017-10-29 DIAGNOSIS — Z7982 Long term (current) use of aspirin: Secondary | ICD-10-CM | POA: Diagnosis not present

## 2017-10-29 HISTORY — PX: CARPAL TUNNEL RELEASE: SHX101

## 2017-10-29 SURGERY — CARPAL TUNNEL RELEASE
Anesthesia: General | Site: Wrist | Laterality: Left | Wound class: Clean

## 2017-10-29 MED ORDER — LIDOCAINE HCL (CARDIAC) 20 MG/ML IV SOLN
INTRAVENOUS | Status: DC | PRN
Start: 2017-10-29 — End: 2017-10-29
  Administered 2017-10-29: 20 mg via INTRAVENOUS

## 2017-10-29 MED ORDER — LIDOCAINE-EPINEPHRINE 1 %-1:100000 IJ SOLN
INTRAMUSCULAR | Status: DC | PRN
  Administered 2017-10-29: 30 mL

## 2017-10-29 MED ORDER — PROPOFOL 500 MG/50ML IV EMUL
INTRAVENOUS | Status: AC
  Filled 2017-10-29: qty 50

## 2017-10-29 MED ORDER — DEXAMETHASONE SODIUM PHOSPHATE 10 MG/ML IJ SOLN
INTRAMUSCULAR | Status: AC
  Filled 2017-10-29: qty 1

## 2017-10-29 MED ORDER — ONDANSETRON HCL 4 MG/2ML IJ SOLN: 4.0000 mg | Freq: Once | INTRAMUSCULAR | Status: DC | PRN

## 2017-10-29 MED ORDER — PROPOFOL 10 MG/ML IV BOLUS
INTRAVENOUS | Status: DC | PRN
  Administered 2017-10-29 (×2): 10 mg via INTRAVENOUS
  Administered 2017-10-29: 20 mg via INTRAVENOUS

## 2017-10-29 MED ORDER — LACTATED RINGERS IV SOLN
INTRAVENOUS | Status: DC
  Administered 2017-10-29 (×2): via INTRAVENOUS

## 2017-10-29 MED ORDER — FAMOTIDINE 20 MG PO TABS
ORAL_TABLET | ORAL | Status: AC
  Administered 2017-10-29: 20 mg via ORAL
  Filled 2017-10-29: qty 1

## 2017-10-29 MED ORDER — ONDANSETRON HCL 4 MG/2ML IJ SOLN
INTRAMUSCULAR | Status: DC | PRN
  Administered 2017-10-29: 4 mg via INTRAVENOUS

## 2017-10-29 MED ORDER — DEXAMETHASONE SODIUM PHOSPHATE 10 MG/ML IJ SOLN
INTRAMUSCULAR | Status: DC | PRN
  Administered 2017-10-29: 10 mg via INTRAVENOUS

## 2017-10-29 MED ORDER — FENTANYL CITRATE (PF) 100 MCG/2ML IJ SOLN
INTRAMUSCULAR | Status: AC
  Filled 2017-10-29: qty 2

## 2017-10-29 MED ORDER — SODIUM CHLORIDE 0.9 % IR SOLN
Status: DC | PRN
  Administered 2017-10-29: 1000 mL

## 2017-10-29 MED ORDER — FAMOTIDINE 20 MG PO TABS
20.0000 mg | ORAL_TABLET | Freq: Once | ORAL | Status: AC
  Administered 2017-10-29: 20 mg via ORAL

## 2017-10-29 MED ORDER — FENTANYL CITRATE (PF) 100 MCG/2ML IJ SOLN: 25.0000 ug | INTRAMUSCULAR | Status: DC | PRN

## 2017-10-29 MED ORDER — PROPOFOL 10 MG/ML IV BOLUS: INTRAVENOUS | Status: DC | PRN

## 2017-10-29 MED ORDER — LIDOCAINE-EPINEPHRINE 1 %-1:100000 IJ SOLN
INTRAMUSCULAR | Status: AC
Start: 2017-10-29 — End: 2017-10-29
  Filled 2017-10-29: qty 1

## 2017-10-29 MED ORDER — CEFAZOLIN SODIUM-DEXTROSE 2-4 GM/100ML-% IV SOLN: 2.0000 g | Freq: Once | INTRAVENOUS | Status: DC

## 2017-10-29 MED ORDER — LIDOCAINE HCL (PF) 2 % IJ SOLN
INTRAMUSCULAR | Status: AC
  Filled 2017-10-29: qty 10

## 2017-10-29 MED ORDER — PROPOFOL 10 MG/ML IV BOLUS
INTRAVENOUS | Status: AC
  Filled 2017-10-29: qty 20

## 2017-10-29 MED ORDER — LIDOCAINE HCL (PF) 1 % IJ SOLN
INTRAMUSCULAR | Status: AC
  Filled 2017-10-29: qty 30

## 2017-10-29 MED ORDER — PROPOFOL 500 MG/50ML IV EMUL
INTRAVENOUS | Status: DC | PRN
  Administered 2017-10-29: 180 ug/kg/min via INTRAVENOUS

## 2017-10-29 MED ORDER — FENTANYL CITRATE (PF) 100 MCG/2ML IJ SOLN
INTRAMUSCULAR | Status: DC | PRN
  Administered 2017-10-29 (×2): 25 ug via INTRAVENOUS

## 2017-10-29 SURGICAL SUPPLY — 34 items
BNDG COHESIVE 4X5 TAN STRL (GAUZE/BANDAGES/DRESSINGS) ×2 IMPLANT
BNDG GAUZE 4.5X4.1 6PLY STRL (MISCELLANEOUS) ×2 IMPLANT
CANISTER SUCT 1200ML W/VALVE (MISCELLANEOUS) ×2 IMPLANT
CHLORAPREP W/TINT 26ML (MISCELLANEOUS) ×4 IMPLANT
CORD BIP STRL DISP 12FT (MISCELLANEOUS) ×2 IMPLANT
DERMABOND ADVANCED (GAUZE/BANDAGES/DRESSINGS) ×1
DERMABOND ADVANCED .7 DNX12 (GAUZE/BANDAGES/DRESSINGS) ×1 IMPLANT
DRSG TELFA 3X8 NADH (GAUZE/BANDAGES/DRESSINGS) IMPLANT
ELECT CAUTERY BLADE TIP 2.5 (TIP) ×2
ELECTRODE CAUTERY BLDE TIP 2.5 (TIP) ×1 IMPLANT
FORCEPS JEWEL BIP 4-3/4 STR (INSTRUMENTS) ×2 IMPLANT
GAUZE FLUFF 18X24 1PLY STRL (GAUZE/BANDAGES/DRESSINGS) IMPLANT
GAUZE PETRO XEROFOAM 1X8 (MISCELLANEOUS) ×2 IMPLANT
GAUZE SPONGE 4X4 12PLY STRL (GAUZE/BANDAGES/DRESSINGS) ×4 IMPLANT
GAUZE XEROFORM 4X4 STRL (GAUZE/BANDAGES/DRESSINGS) IMPLANT
GLOVE BIOGEL PI IND STRL 8 (GLOVE) ×1 IMPLANT
GLOVE BIOGEL PI INDICATOR 8 (GLOVE) ×1
GLOVE SURG SYN 8.0 (GLOVE) ×2 IMPLANT
GOWN STRL REUS W/ TWL LRG LVL3 (GOWN DISPOSABLE) ×2 IMPLANT
GOWN STRL REUS W/ TWL XL LVL3 (GOWN DISPOSABLE) ×1 IMPLANT
GOWN STRL REUS W/TWL LRG LVL3 (GOWN DISPOSABLE) ×2
GOWN STRL REUS W/TWL XL LVL3 (GOWN DISPOSABLE) ×1
KIT TURNOVER KIT A (KITS) ×2 IMPLANT
NS IRRIG 1000ML POUR BTL (IV SOLUTION) ×2 IMPLANT
PACK EXTREMITY ARMC (MISCELLANEOUS) ×2 IMPLANT
SOL PREP PVP 2OZ (MISCELLANEOUS) ×2
SOLUTION PREP PVP 2OZ (MISCELLANEOUS) ×1 IMPLANT
STOCKINETTE STRL 4IN 9604848 (GAUZE/BANDAGES/DRESSINGS) ×2 IMPLANT
SUT ETHILON 3-0 FS-10 30 BLK (SUTURE) ×2
SUT VIC AB 2-0 SH 27 (SUTURE) ×3
SUT VIC AB 2-0 SH 27XBRD (SUTURE) ×3 IMPLANT
SUT VIC AB 3-0 SH 27 (SUTURE) ×1
SUT VIC AB 3-0 SH 27X BRD (SUTURE) ×1 IMPLANT
SUTURE EHLN 3-0 FS-10 30 BLK (SUTURE) ×1 IMPLANT

## 2017-10-29 NOTE — Anesthesia Procedure Notes (Signed)
Date/Time: 10/29/2017 7:20 AM Performed by: Allean Found, CRNA Pre-anesthesia Checklist: Patient identified, Emergency Drugs available, Suction available, Patient being monitored and Timeout performed Oxygen Delivery Method: Nasal cannula Placement Confirmation: positive ETCO2 Difficulty Due To: Difficult Airway- due to anterior larynx Comments: obtrtucted at propofol 130 mcg/kg/min

## 2017-10-29 NOTE — Final Progress Note (Signed)
Patient was seen in recovery following left carpal tunnel release procedure. Patient tolerated procedure well without complication. Patient denies pain at this time. Continues to have numbness in first three digits of left hand.   PE: Able to move all left digits without difficulty.  Incision dressed.  Thumb:  Strength intact  Plan:  Continue pain control with tylenol and NSAIDs as needed. Post op instructions provided including, keeping hand elevated and finger and wrist exercises. Patient is scheduled to follow up in 2 weeks for suture removal and to monitor progress.

## 2017-10-29 NOTE — Transfer of Care (Signed)
Immediate Anesthesia Transfer of Care Note  Patient: Cole Barker  Procedure(s) Performed: CARPAL TUNNEL RELEASE (Left Wrist)  Patient Location: PACU  Anesthesia Type:General  Level of Consciousness: awake  Airway & Oxygen Therapy: Patient Spontanous Breathing and Patient connected to nasal cannula oxygen  Post-op Assessment: Report given to RN and Post -op Vital signs reviewed and stable  Post vital signs: Reviewed and stable  Last Vitals:  Vitals Value Taken Time  BP 108/79 10/29/2017  8:24 AM  Temp    Pulse 77 10/29/2017  8:24 AM  Resp 28 10/29/2017  8:24 AM  SpO2 94 % 10/29/2017  8:24 AM  Vitals shown include unvalidated device data.  Last Pain:  Vitals:   10/29/17 0611  TempSrc: (P) Oral         Complications: No apparent anesthesia complications

## 2017-10-29 NOTE — Anesthesia Preprocedure Evaluation (Signed)
Anesthesia Evaluation  Patient identified by MRN, date of birth, ID band Patient awake    Reviewed: Allergy & Precautions, NPO status , Patient's Chart, lab work & pertinent test results  History of Anesthesia Complications Negative for: history of anesthetic complications  Airway Mallampati: II       Dental   Pulmonary neg sleep apnea, neg COPD,           Cardiovascular hypertension, Pt. on medications (-) Past MI and (-) CHF (-) dysrhythmias (-) Valvular Problems/Murmurs     Neuro/Psych neg Seizures    GI/Hepatic Neg liver ROS, neg GERD  ,  Endo/Other  neg diabetes  Renal/GU negative Renal ROS     Musculoskeletal   Abdominal   Peds  Hematology   Anesthesia Other Findings   Reproductive/Obstetrics                             Anesthesia Physical Anesthesia Plan  ASA: II  Anesthesia Plan: General   Post-op Pain Management:    Induction:   PONV Risk Score and Plan: 2 and Dexamethasone and Ondansetron  Airway Management Planned: LMA  Additional Equipment:   Intra-op Plan:   Post-operative Plan:   Informed Consent: I have reviewed the patients History and Physical, chart, labs and discussed the procedure including the risks, benefits and alternatives for the proposed anesthesia with the patient or authorized representative who has indicated his/her understanding and acceptance.     Plan Discussed with:   Anesthesia Plan Comments:         Anesthesia Quick Evaluation

## 2017-10-29 NOTE — Anesthesia Postprocedure Evaluation (Signed)
Anesthesia Post Note  Patient: Cole Barker  Procedure(s) Performed: CARPAL TUNNEL RELEASE (Left Wrist)  Patient location during evaluation: PACU Anesthesia Type: General Level of consciousness: awake and alert Pain management: pain level controlled Vital Signs Assessment: post-procedure vital signs reviewed and stable Respiratory status: spontaneous breathing and respiratory function stable Cardiovascular status: stable Anesthetic complications: no     Last Vitals:  Vitals:   10/29/17 0836 10/29/17 0841  BP:  118/68  Pulse:  75  Resp:  20  Temp: (!) 36.3 C   SpO2: 94% 96%    Last Pain:  Vitals:   10/29/17 0836  TempSrc:   PainSc: 0-No pain                 Genieve Ramaswamy K

## 2017-10-29 NOTE — H&P (Signed)
Cole Barker is an 82 y.o. male.    Chief Complaint: Bilateral hand numbness and pain HPI: Cole Barker is here for evaluation of ongoing bilateral hand numbness and weakness. He had an EMG confirming bilateral CTS and had right hand decompression perfortmed a month ago that is now foing well. He presents for left handed decompression.        Past Medical History:  Diagnosis Date  . Hyperlipidemia   . Hypertension   . Neuromuscular disorder ([place])    neuropathy/corpal tunnel         Past Surgical History:  Procedure Laterality Date  . JOINT REPLACEMENT  2009   left    History reviewed. No pertinent family history. Social History:  reports that  has never smoked. he has never used smokeless tobacco. He reports that he drinks alcohol. He reports that he does not use drugs.  Allergies:       Allergies  Allergen Reactions  . Adhesive [Tape]     Burns, tears up skin          Medications Prior to Admission  Medication Sig Dispense Refill  . amLODipine (NORVASC) 10 MG tablet Take 10 mg by mouth daily.  3  . atorvastatin (LIPITOR) 20 MG tablet Take 10 mg by mouth daily.    . cetirizine (ZYRTEC) 10 MG tablet Take 10 mg by mouth daily.    . CHROMIUM GTF PO Take 1 tablet by mouth daily.    . diclofenac sodium (VOLTAREN) 1 % GEL Apply 1 application topically daily as needed (pain).    . folic acid (FOLVITE) 161 MCG tablet Take 800 mcg by mouth daily.    Marland Kitchen lisinopril (PRINIVIL,ZESTRIL) 5 MG tablet Take 5 mg by mouth daily.    . naproxen sodium (ALEVE) 220 MG tablet Take 220 mg by mouth daily as needed (pain).    Marland Kitchen OVER THE COUNTER MEDICATION Take 2 tablets by mouth daily. everflex    . OVER THE COUNTER MEDICATION Take 1 tablet by mouth daily. Coryza forte otc supplement    . OVER THE COUNTER MEDICATION Take 2 tablets by mouth daily. Fenshi otc supplement    . Potassium 99 MG TABS Take 99 mg by mouth daily.    Marland Kitchen triamcinolone cream  (KENALOG) 0.1 % Apply 1 application topically 2 (two) times daily. For 14 days  0  . Vitamins A & D 10000-400 units TABS Take 1 tablet by mouth daily.    Marland Kitchen aspirin EC 81 MG tablet Take 81 mg by mouth daily.      LabResultsLast48Hours  No results found for this or any previous visit (from the past 48 hour(s)).   ImagingResults(Last48hours)  No results found.    ROS General ROS: Negative Psychological ROS: Negative Ophthalmic ROS: Negative ENT ROS: Negative Hematological and Lymphatic ROS: Negative  Endocrine ROS: Negative Respiratory ROS: Negative Cardiovascular ROS: Negative Gastrointestinal ROS: Negative Genito-Urinary ROS: Negative Musculoskeletal ROS: Negative for neck pain Neurological ROS: Positive for hand numbness and weakness Dermatological ROS: Negative  Blood pressure (!) 146/66, pulse 80, temperature 97.6 F (36.4 C), temperature source Oral, resp. rate 16, SpO2 98 %. Physical Exam  General appearance: Alert, cooperative, in no acute distress Head: Normocephalic, atraumatic Eyes: Normal, EOM intact Oropharynx: Moist without lesions Heart: Normal, regular rate and rhythm, without murmur Lungs: Clear to auscultation, good air exchange Ext: No edema in LE bilaterally, warm extremities, well healed right hand incision  Neurologic exam:  Mental status: alertness: alert, affect: normal Speech: fluent  and clear Cranial nerves:  VII:no evidence of facial droop or weakness XI: trapezius strength symmetric Motor: 5 out of 5 strength in bilateral deltoid, bicep, tricep, wrist extension, APB, interossei Sensory: Decreased light touch over the lateral hand bilaterally, left greater than right. Numbness extends into the first 3-4 digits Gait: normal    Impression: Abnormal study. There is electrodiagnostic evidence of chronic, bilateral, severe carpal tunnel syndrome.    Assessment/Plan Patient with bilateral carpal tunnel syndrome, plan for  left sided decompression  [name], MD 09/17/2017, 1:16 PM

## 2017-10-29 NOTE — Interval H&P Note (Signed)
History and Physical Interval Note:  10/29/2017 6:51 AM  Cole Barker  has presented today for surgery, with the diagnosis of carpal tunnel left  The various methods of treatment have been discussed with the patient and family. After consideration of risks, benefits and other options for treatment, the patient has consented to  Procedure(s): CARPAL TUNNEL RELEASE (Left) as a surgical intervention .  The patient's history has been reviewed, patient examined, no change in status, stable for surgery.  I have reviewed the patient's chart and labs.  Questions were answered to the patient's satisfaction.     [name]

## 2017-10-29 NOTE — Anesthesia Post-op Follow-up Note (Signed)
Anesthesia QCDR form completed.        

## 2017-10-29 NOTE — Progress Notes (Signed)
Pharmacy consulted to dose Cefazolin weight based for surgical prophylaxis.  Ordered 2 gram once dose based on recent weight of 97.6 kg from 3.12  Cole Barker, [place] 10/29/2017

## 2017-10-29 NOTE — Op Note (Addendum)
SURGERY DATE:10/29/2017  PRE-OP DIAGNOSIS: Carpal tunnel syndrome of left wrist [G56.02]   POST-OP DIAGNOSIS:Post-Op Diagnosis Codes: * Carpal tunnel syndrome of right wrist [G56.02]  Procedure(s): NEUROPLASTY AND/OR TRANSPOSITION; MEDIAN NERVE AT CARPAL TUNNEL (Left)  SURGEON: Surgeon(s) and Role: Malen Gauze, MD - Primary       Marin Olp, [place] - Assisting  ANESTHESIA:Monitored Anesthesia Care  OPERATIVE FINDINGS:Compressive ligament at left wrist  OPERATIVE REPORT: Indication Cole Barker was seen in clinic on1/8 and found to have ongoing bilateralhand numbness and pain. An EMG/ NCS showed bilateral carpal tunnel syndrome. This was interfering with hisdaily lifestyle. Hehad failed conservative management including medication. Surgery for decompression of the nerves were discussed. He underwent decompression of the right hand first on 2/11 and returns for left hand decompression as the right wound has healed well. The risks of surgery including numbness and weakness in hand, pain, infection, and hematoma were discussed. He decided to proceed with the left hand  Procedure The patient was transferred to the operating room. The patient was given preoperative prophylactic IV antibiotics. For anesthesia, IV conscious sedation was delivered by the anesthesia service.The patient was positioned supine, positioned with theleftarm extended resting on an armboard. All pressure points were carefully padded. The planned incision was demarcated based at the wrist at the volar crease of the lefthand and the interspace between the third and fourth digit. A TIME OUT was performed   The entireleft arm was prepped and draped in standard sterile technique. An approximately 4-cm straight incision was performed in the proximal palm after infiltration with local anesthetic. The incision extended from distal wrist crease toward space between digits 3 and 4. It was extended  down to the palmar fascia to the level of the flexor retinaculum. Subsequently, the distal transverse carpal ligament was incised and the perineural fat exposed. Neuroplasty was carried out both proximally and distally, incising the entire transverse carpal ligament to release the entire median nerve. The median nerve was preserved intact throughout the entire procedure. The nerve was seen to be mildly widened due to the compression. A blunt dissector was passed proximally and distally to ensure that the entire length of the flexor retinaculum had been incised and the nerve decompressed. There were no other points of nerve compression.   The wound was irrigated with antibiotic saline solution until clear and hemostasis was meticulously achieved with bipolar electrocautery. The subdermis was closed with 2-0 vicryl. The skin was closed with a interrupted 3-0 nylon with vertical mattress sutures. The incision was dressed in a clean dry sterile dressing. All sponge counts, needle counts, and instrument counts were correct at the end of the case x 2. All pressure points remained padded throughout the entire case. The patient tolerated the procedure well without any complications and was transferred in stable condition to the PACU.    ESTIMATED BLOOD LOSS:5 cc   SPECIMENS: None  IMPLANT None  ATTESTATION  I performed the case in its entiretywith assistance of PA, Corrie Mckusick, [place]

## 2017-10-29 NOTE — Discharge Instructions (Addendum)
NEUROSURGERY DISCHARGE INSTRUCTIONS  Admission Diagnosis: Carpal tunnel syndrome  Discharge Diagnosis: Carpal tunnel syndrome  Operative procedure: Carpal tunnel release  The following are instructions to help in your recovery once you have been discharged from the hospital. Even if you feel well, it is important that you follow these activity guidelines.  What to do after you leave the hospital:  Recommended diet:  Increase protein intake to promote wound healing. You may return to your usual diet. However, you may experience discomfort when swallowing in the first month after your surgery. This is normal. You may find that softer foods are more comfortable for you to swallow. Be sure to stay hydrated.   Recommended activity: Finger movements and wrist exercises as instructed. Keep hand elevated for approximately 3 days to decrease inflammation   If you smoke, we strongly recommend that you quit. Smoking has been proven to interfere with normal bone healing and will dramatically reduce the success rate of your surgery. Please contact [facility] (800-QUIT-NOW) and use the resources at www.[facility].com for assistance in stopping smoking.   Medications  Do not restart Aspirin until seven days after surgery  You may restart home medications.   Wound Care Instructions  If you have a dressing on your incision, remove it three days after your surgery. Keep your incision area clean and dry.   If you have staples or stitches on your incision, you should have a follow up scheduled for removal. If you do not have staples or stitches, you will have steri-strips (small pieces of surgical tape) or Dermabond glue. The steri-strips/glue should begin to peel away within about a week (it is fine if the steri-strips fall off before then). If the strips are still in place one week after your surgery, you may gently remove them.    Please Report any of the following: However, should you experience any  of the following, contact us immediately:   New numbness or weakness   Pain that is progressively getting worse, and is not relieved by your pain medication, muscle relaxers, rest, and warm compresses   Bleeding, redness, swelling, pain, or drainage from surgical incision   Chills or flu-like symptoms   Fever greater than 101.0 F (38.3 C)   Inability to eat, drink fluids, or take medications   Problems with bowel or bladder functions   Difficulty breathing or shortness of breath   Warmth, tenderness, or swelling in your calf    Additional Follow up appointments During office hours (Monday-Friday 9 am to 5 pm), please call your physician at [phone]  After hours and weekends, please call the [facility] Operator at  [phone] and ask for the Neurosurgeon On Call   For a life-threatening emergency, call [place]   1) The drugs that you were given will stay in your system until tomorrow so for the next 24 hours you should not:  A) Drive an automobile B) Make any legal decisions C) Drink any alcoholic beverage   2) You may resume regular meals tomorrow.  Today it is better to start with liquids and gradually work up to solid foods.  You may eat anything you prefer, but it is better to start with liquids, then soup and crackers, and gradually work up to solid foods.   3) Please notify your doctor immediately if you have any unusual bleeding, trouble breathing, redness and pain at the surgery site, drainage, fever, or pain not relieved by medication.  4) Additional Instructions:        Please contact your physician with any problems or Same Day Surgery at [phone], Monday through Friday 6 am to 4 pm, or [place] at [facility] number at [phone].

## 2017-11-02 DIAGNOSIS — Z9889 Other specified postprocedural states: Secondary | ICD-10-CM | POA: Diagnosis not present

## 2017-11-02 DIAGNOSIS — G5601 Carpal tunnel syndrome, right upper limb: Secondary | ICD-10-CM | POA: Diagnosis not present

## 2017-11-05 DIAGNOSIS — G5601 Carpal tunnel syndrome, right upper limb: Secondary | ICD-10-CM | POA: Diagnosis not present

## 2017-11-05 DIAGNOSIS — Z9889 Other specified postprocedural states: Secondary | ICD-10-CM | POA: Diagnosis not present

## 2017-11-08 DIAGNOSIS — Z9889 Other specified postprocedural states: Secondary | ICD-10-CM | POA: Diagnosis not present

## 2017-11-08 DIAGNOSIS — G5601 Carpal tunnel syndrome, right upper limb: Secondary | ICD-10-CM | POA: Diagnosis not present

## 2017-11-12 DIAGNOSIS — Z9889 Other specified postprocedural states: Secondary | ICD-10-CM | POA: Diagnosis not present

## 2017-11-12 DIAGNOSIS — G5601 Carpal tunnel syndrome, right upper limb: Secondary | ICD-10-CM | POA: Diagnosis not present

## 2017-11-15 DIAGNOSIS — Z9889 Other specified postprocedural states: Secondary | ICD-10-CM | POA: Diagnosis not present

## 2017-11-15 DIAGNOSIS — G5601 Carpal tunnel syndrome, right upper limb: Secondary | ICD-10-CM | POA: Diagnosis not present

## 2017-11-15 DIAGNOSIS — G5602 Carpal tunnel syndrome, left upper limb: Secondary | ICD-10-CM | POA: Diagnosis not present

## 2017-11-19 DIAGNOSIS — Z9889 Other specified postprocedural states: Secondary | ICD-10-CM | POA: Diagnosis not present

## 2017-11-19 DIAGNOSIS — G5601 Carpal tunnel syndrome, right upper limb: Secondary | ICD-10-CM | POA: Diagnosis not present

## 2017-11-19 DIAGNOSIS — G5602 Carpal tunnel syndrome, left upper limb: Secondary | ICD-10-CM | POA: Diagnosis not present

## 2017-11-22 DIAGNOSIS — Z9889 Other specified postprocedural states: Secondary | ICD-10-CM | POA: Diagnosis not present

## 2017-11-22 DIAGNOSIS — G5602 Carpal tunnel syndrome, left upper limb: Secondary | ICD-10-CM | POA: Diagnosis not present

## 2017-11-22 DIAGNOSIS — G5601 Carpal tunnel syndrome, right upper limb: Secondary | ICD-10-CM | POA: Diagnosis not present

## 2017-11-26 DIAGNOSIS — Z79899 Other long term (current) drug therapy: Secondary | ICD-10-CM | POA: Diagnosis not present

## 2017-11-26 DIAGNOSIS — E559 Vitamin D deficiency, unspecified: Secondary | ICD-10-CM | POA: Diagnosis not present

## 2017-11-26 DIAGNOSIS — I10 Essential (primary) hypertension: Secondary | ICD-10-CM | POA: Diagnosis not present

## 2017-11-26 DIAGNOSIS — E782 Mixed hyperlipidemia: Secondary | ICD-10-CM | POA: Diagnosis not present

## 2017-11-30 DIAGNOSIS — G5602 Carpal tunnel syndrome, left upper limb: Secondary | ICD-10-CM | POA: Diagnosis not present

## 2017-11-30 DIAGNOSIS — Z9889 Other specified postprocedural states: Secondary | ICD-10-CM | POA: Diagnosis not present

## 2017-11-30 DIAGNOSIS — G5601 Carpal tunnel syndrome, right upper limb: Secondary | ICD-10-CM | POA: Diagnosis not present

## 2017-12-03 DIAGNOSIS — G5602 Carpal tunnel syndrome, left upper limb: Secondary | ICD-10-CM | POA: Diagnosis not present

## 2017-12-03 DIAGNOSIS — G5601 Carpal tunnel syndrome, right upper limb: Secondary | ICD-10-CM | POA: Diagnosis not present

## 2017-12-03 DIAGNOSIS — Z9889 Other specified postprocedural states: Secondary | ICD-10-CM | POA: Diagnosis not present

## 2017-12-04 DIAGNOSIS — I10 Essential (primary) hypertension: Secondary | ICD-10-CM | POA: Diagnosis not present

## 2017-12-04 DIAGNOSIS — Z6829 Body mass index (BMI) 29.0-29.9, adult: Secondary | ICD-10-CM | POA: Diagnosis not present

## 2017-12-04 DIAGNOSIS — E782 Mixed hyperlipidemia: Secondary | ICD-10-CM | POA: Diagnosis not present

## 2017-12-04 DIAGNOSIS — E559 Vitamin D deficiency, unspecified: Secondary | ICD-10-CM | POA: Diagnosis not present

## 2017-12-07 DIAGNOSIS — G5601 Carpal tunnel syndrome, right upper limb: Secondary | ICD-10-CM | POA: Diagnosis not present

## 2017-12-07 DIAGNOSIS — Z9889 Other specified postprocedural states: Secondary | ICD-10-CM | POA: Diagnosis not present

## 2017-12-07 DIAGNOSIS — G5602 Carpal tunnel syndrome, left upper limb: Secondary | ICD-10-CM | POA: Diagnosis not present

## 2017-12-10 DIAGNOSIS — G5601 Carpal tunnel syndrome, right upper limb: Secondary | ICD-10-CM | POA: Diagnosis not present

## 2017-12-10 DIAGNOSIS — G5602 Carpal tunnel syndrome, left upper limb: Secondary | ICD-10-CM | POA: Diagnosis not present

## 2017-12-10 DIAGNOSIS — Z9889 Other specified postprocedural states: Secondary | ICD-10-CM | POA: Diagnosis not present

## 2017-12-13 DIAGNOSIS — G5601 Carpal tunnel syndrome, right upper limb: Secondary | ICD-10-CM | POA: Diagnosis not present

## 2017-12-13 DIAGNOSIS — Z9889 Other specified postprocedural states: Secondary | ICD-10-CM | POA: Diagnosis not present

## 2017-12-13 DIAGNOSIS — G5602 Carpal tunnel syndrome, left upper limb: Secondary | ICD-10-CM | POA: Diagnosis not present

## 2017-12-17 DIAGNOSIS — Z9889 Other specified postprocedural states: Secondary | ICD-10-CM | POA: Diagnosis not present

## 2017-12-17 DIAGNOSIS — G5601 Carpal tunnel syndrome, right upper limb: Secondary | ICD-10-CM | POA: Diagnosis not present

## 2017-12-17 DIAGNOSIS — G5602 Carpal tunnel syndrome, left upper limb: Secondary | ICD-10-CM | POA: Diagnosis not present

## 2017-12-20 DIAGNOSIS — L570 Actinic keratosis: Secondary | ICD-10-CM | POA: Diagnosis not present

## 2017-12-20 DIAGNOSIS — D1801 Hemangioma of skin and subcutaneous tissue: Secondary | ICD-10-CM | POA: Diagnosis not present

## 2017-12-20 DIAGNOSIS — L814 Other melanin hyperpigmentation: Secondary | ICD-10-CM | POA: Diagnosis not present

## 2017-12-20 DIAGNOSIS — D485 Neoplasm of uncertain behavior of skin: Secondary | ICD-10-CM | POA: Diagnosis not present

## 2017-12-20 DIAGNOSIS — L905 Scar conditions and fibrosis of skin: Secondary | ICD-10-CM | POA: Diagnosis not present

## 2017-12-20 DIAGNOSIS — L821 Other seborrheic keratosis: Secondary | ICD-10-CM | POA: Diagnosis not present

## 2017-12-20 DIAGNOSIS — D225 Melanocytic nevi of trunk: Secondary | ICD-10-CM | POA: Diagnosis not present

## 2017-12-21 DIAGNOSIS — G5602 Carpal tunnel syndrome, left upper limb: Secondary | ICD-10-CM | POA: Diagnosis not present

## 2017-12-21 DIAGNOSIS — G5601 Carpal tunnel syndrome, right upper limb: Secondary | ICD-10-CM | POA: Diagnosis not present

## 2017-12-21 DIAGNOSIS — Z9889 Other specified postprocedural states: Secondary | ICD-10-CM | POA: Diagnosis not present

## 2017-12-24 DIAGNOSIS — G5601 Carpal tunnel syndrome, right upper limb: Secondary | ICD-10-CM | POA: Diagnosis not present

## 2017-12-24 DIAGNOSIS — G5602 Carpal tunnel syndrome, left upper limb: Secondary | ICD-10-CM | POA: Diagnosis not present

## 2017-12-24 DIAGNOSIS — Z9889 Other specified postprocedural states: Secondary | ICD-10-CM | POA: Diagnosis not present

## 2017-12-27 DIAGNOSIS — Z9889 Other specified postprocedural states: Secondary | ICD-10-CM | POA: Diagnosis not present

## 2017-12-27 DIAGNOSIS — G5602 Carpal tunnel syndrome, left upper limb: Secondary | ICD-10-CM | POA: Diagnosis not present

## 2017-12-27 DIAGNOSIS — G5601 Carpal tunnel syndrome, right upper limb: Secondary | ICD-10-CM | POA: Diagnosis not present

## 2018-01-03 DIAGNOSIS — Z9889 Other specified postprocedural states: Secondary | ICD-10-CM | POA: Diagnosis not present

## 2018-01-03 DIAGNOSIS — G5601 Carpal tunnel syndrome, right upper limb: Secondary | ICD-10-CM | POA: Diagnosis not present

## 2018-01-03 DIAGNOSIS — G5602 Carpal tunnel syndrome, left upper limb: Secondary | ICD-10-CM | POA: Diagnosis not present

## 2018-01-14 DIAGNOSIS — Z6829 Body mass index (BMI) 29.0-29.9, adult: Secondary | ICD-10-CM | POA: Diagnosis not present

## 2018-01-14 DIAGNOSIS — H6121 Impacted cerumen, right ear: Secondary | ICD-10-CM | POA: Diagnosis not present

## 2018-01-14 DIAGNOSIS — I10 Essential (primary) hypertension: Secondary | ICD-10-CM | POA: Diagnosis not present

## 2018-01-14 DIAGNOSIS — Z0000 Encounter for general adult medical examination without abnormal findings: Secondary | ICD-10-CM | POA: Diagnosis not present

## 2018-01-15 DIAGNOSIS — Z9889 Other specified postprocedural states: Secondary | ICD-10-CM | POA: Diagnosis not present

## 2018-01-15 DIAGNOSIS — G5602 Carpal tunnel syndrome, left upper limb: Secondary | ICD-10-CM | POA: Diagnosis not present

## 2018-01-15 DIAGNOSIS — G5601 Carpal tunnel syndrome, right upper limb: Secondary | ICD-10-CM | POA: Diagnosis not present

## 2018-01-17 DIAGNOSIS — G5602 Carpal tunnel syndrome, left upper limb: Secondary | ICD-10-CM | POA: Diagnosis not present

## 2018-01-17 DIAGNOSIS — Z9889 Other specified postprocedural states: Secondary | ICD-10-CM | POA: Diagnosis not present

## 2018-01-17 DIAGNOSIS — G5601 Carpal tunnel syndrome, right upper limb: Secondary | ICD-10-CM | POA: Diagnosis not present

## 2018-01-21 DIAGNOSIS — Z9889 Other specified postprocedural states: Secondary | ICD-10-CM | POA: Diagnosis not present

## 2018-01-21 DIAGNOSIS — G5602 Carpal tunnel syndrome, left upper limb: Secondary | ICD-10-CM | POA: Diagnosis not present

## 2018-01-21 DIAGNOSIS — G5601 Carpal tunnel syndrome, right upper limb: Secondary | ICD-10-CM | POA: Diagnosis not present

## 2018-01-29 DIAGNOSIS — G5602 Carpal tunnel syndrome, left upper limb: Secondary | ICD-10-CM | POA: Diagnosis not present

## 2018-01-29 DIAGNOSIS — Z9889 Other specified postprocedural states: Secondary | ICD-10-CM | POA: Diagnosis not present

## 2018-01-29 DIAGNOSIS — G5601 Carpal tunnel syndrome, right upper limb: Secondary | ICD-10-CM | POA: Diagnosis not present

## 2018-02-01 DIAGNOSIS — Z9889 Other specified postprocedural states: Secondary | ICD-10-CM | POA: Diagnosis not present

## 2018-02-01 DIAGNOSIS — G5602 Carpal tunnel syndrome, left upper limb: Secondary | ICD-10-CM | POA: Diagnosis not present

## 2018-02-01 DIAGNOSIS — G5601 Carpal tunnel syndrome, right upper limb: Secondary | ICD-10-CM | POA: Diagnosis not present

## 2018-02-05 DIAGNOSIS — G5601 Carpal tunnel syndrome, right upper limb: Secondary | ICD-10-CM | POA: Diagnosis not present

## 2018-02-05 DIAGNOSIS — G5602 Carpal tunnel syndrome, left upper limb: Secondary | ICD-10-CM | POA: Diagnosis not present

## 2018-02-05 DIAGNOSIS — Z9889 Other specified postprocedural states: Secondary | ICD-10-CM | POA: Diagnosis not present

## 2018-02-08 DIAGNOSIS — G5601 Carpal tunnel syndrome, right upper limb: Secondary | ICD-10-CM | POA: Diagnosis not present

## 2018-02-08 DIAGNOSIS — G5602 Carpal tunnel syndrome, left upper limb: Secondary | ICD-10-CM | POA: Diagnosis not present

## 2018-02-08 DIAGNOSIS — Z9889 Other specified postprocedural states: Secondary | ICD-10-CM | POA: Diagnosis not present

## 2018-02-11 DIAGNOSIS — D239 Other benign neoplasm of skin, unspecified: Secondary | ICD-10-CM | POA: Diagnosis not present

## 2018-02-11 DIAGNOSIS — D235 Other benign neoplasm of skin of trunk: Secondary | ICD-10-CM | POA: Diagnosis not present

## 2018-02-11 DIAGNOSIS — L988 Other specified disorders of the skin and subcutaneous tissue: Secondary | ICD-10-CM | POA: Diagnosis not present

## 2018-02-12 DIAGNOSIS — G5601 Carpal tunnel syndrome, right upper limb: Secondary | ICD-10-CM | POA: Diagnosis not present

## 2018-02-12 DIAGNOSIS — Z9889 Other specified postprocedural states: Secondary | ICD-10-CM | POA: Diagnosis not present

## 2018-02-12 DIAGNOSIS — G5602 Carpal tunnel syndrome, left upper limb: Secondary | ICD-10-CM | POA: Diagnosis not present

## 2018-02-15 DIAGNOSIS — G5601 Carpal tunnel syndrome, right upper limb: Secondary | ICD-10-CM | POA: Diagnosis not present

## 2018-02-15 DIAGNOSIS — G5602 Carpal tunnel syndrome, left upper limb: Secondary | ICD-10-CM | POA: Diagnosis not present

## 2018-02-15 DIAGNOSIS — Z9889 Other specified postprocedural states: Secondary | ICD-10-CM | POA: Diagnosis not present

## 2018-02-19 DIAGNOSIS — G5601 Carpal tunnel syndrome, right upper limb: Secondary | ICD-10-CM | POA: Diagnosis not present

## 2018-02-19 DIAGNOSIS — G5602 Carpal tunnel syndrome, left upper limb: Secondary | ICD-10-CM | POA: Diagnosis not present

## 2018-02-19 DIAGNOSIS — Z9889 Other specified postprocedural states: Secondary | ICD-10-CM | POA: Diagnosis not present

## 2018-02-22 DIAGNOSIS — Z9889 Other specified postprocedural states: Secondary | ICD-10-CM | POA: Diagnosis not present

## 2018-02-22 DIAGNOSIS — G5602 Carpal tunnel syndrome, left upper limb: Secondary | ICD-10-CM | POA: Diagnosis not present

## 2018-02-22 DIAGNOSIS — G5601 Carpal tunnel syndrome, right upper limb: Secondary | ICD-10-CM | POA: Diagnosis not present

## 2018-02-26 DIAGNOSIS — G5602 Carpal tunnel syndrome, left upper limb: Secondary | ICD-10-CM | POA: Diagnosis not present

## 2018-02-26 DIAGNOSIS — G5601 Carpal tunnel syndrome, right upper limb: Secondary | ICD-10-CM | POA: Diagnosis not present

## 2018-02-26 DIAGNOSIS — Z9889 Other specified postprocedural states: Secondary | ICD-10-CM | POA: Diagnosis not present

## 2018-03-05 DIAGNOSIS — G5601 Carpal tunnel syndrome, right upper limb: Secondary | ICD-10-CM | POA: Diagnosis not present

## 2018-03-05 DIAGNOSIS — G5602 Carpal tunnel syndrome, left upper limb: Secondary | ICD-10-CM | POA: Diagnosis not present

## 2018-03-05 DIAGNOSIS — Z9889 Other specified postprocedural states: Secondary | ICD-10-CM | POA: Diagnosis not present

## 2018-03-08 DIAGNOSIS — Z9889 Other specified postprocedural states: Secondary | ICD-10-CM | POA: Diagnosis not present

## 2018-03-08 DIAGNOSIS — G5602 Carpal tunnel syndrome, left upper limb: Secondary | ICD-10-CM | POA: Diagnosis not present

## 2018-03-08 DIAGNOSIS — G5601 Carpal tunnel syndrome, right upper limb: Secondary | ICD-10-CM | POA: Diagnosis not present

## 2018-03-11 ENCOUNTER — Other Ambulatory Visit: Payer: Self-pay

## 2018-03-12 DIAGNOSIS — G5602 Carpal tunnel syndrome, left upper limb: Secondary | ICD-10-CM | POA: Diagnosis not present

## 2018-03-12 DIAGNOSIS — Z9889 Other specified postprocedural states: Secondary | ICD-10-CM | POA: Diagnosis not present

## 2018-03-12 DIAGNOSIS — G5601 Carpal tunnel syndrome, right upper limb: Secondary | ICD-10-CM | POA: Diagnosis not present

## 2018-03-15 DIAGNOSIS — G5602 Carpal tunnel syndrome, left upper limb: Secondary | ICD-10-CM | POA: Diagnosis not present

## 2018-03-15 DIAGNOSIS — Z9889 Other specified postprocedural states: Secondary | ICD-10-CM | POA: Diagnosis not present

## 2018-03-15 DIAGNOSIS — G5601 Carpal tunnel syndrome, right upper limb: Secondary | ICD-10-CM | POA: Diagnosis not present

## 2018-03-26 DIAGNOSIS — G5602 Carpal tunnel syndrome, left upper limb: Secondary | ICD-10-CM | POA: Diagnosis not present

## 2018-03-26 DIAGNOSIS — G5601 Carpal tunnel syndrome, right upper limb: Secondary | ICD-10-CM | POA: Diagnosis not present

## 2018-03-26 DIAGNOSIS — Z9889 Other specified postprocedural states: Secondary | ICD-10-CM | POA: Diagnosis not present

## 2018-03-29 DIAGNOSIS — G5602 Carpal tunnel syndrome, left upper limb: Secondary | ICD-10-CM | POA: Diagnosis not present

## 2018-03-29 DIAGNOSIS — Z9889 Other specified postprocedural states: Secondary | ICD-10-CM | POA: Diagnosis not present

## 2018-03-29 DIAGNOSIS — G5601 Carpal tunnel syndrome, right upper limb: Secondary | ICD-10-CM | POA: Diagnosis not present

## 2018-04-01 DIAGNOSIS — G5602 Carpal tunnel syndrome, left upper limb: Secondary | ICD-10-CM | POA: Diagnosis not present

## 2018-04-01 DIAGNOSIS — Z9889 Other specified postprocedural states: Secondary | ICD-10-CM | POA: Diagnosis not present

## 2018-04-01 DIAGNOSIS — G5601 Carpal tunnel syndrome, right upper limb: Secondary | ICD-10-CM | POA: Diagnosis not present

## 2018-04-05 DIAGNOSIS — G5601 Carpal tunnel syndrome, right upper limb: Secondary | ICD-10-CM | POA: Diagnosis not present

## 2018-04-05 DIAGNOSIS — Z9889 Other specified postprocedural states: Secondary | ICD-10-CM | POA: Diagnosis not present

## 2018-04-05 DIAGNOSIS — G5602 Carpal tunnel syndrome, left upper limb: Secondary | ICD-10-CM | POA: Diagnosis not present

## 2018-04-12 DIAGNOSIS — G5601 Carpal tunnel syndrome, right upper limb: Secondary | ICD-10-CM | POA: Diagnosis not present

## 2018-04-12 DIAGNOSIS — Z9889 Other specified postprocedural states: Secondary | ICD-10-CM | POA: Diagnosis not present

## 2018-04-12 DIAGNOSIS — G5602 Carpal tunnel syndrome, left upper limb: Secondary | ICD-10-CM | POA: Diagnosis not present

## 2018-04-15 DIAGNOSIS — Z9889 Other specified postprocedural states: Secondary | ICD-10-CM | POA: Diagnosis not present

## 2018-04-15 DIAGNOSIS — G5601 Carpal tunnel syndrome, right upper limb: Secondary | ICD-10-CM | POA: Diagnosis not present

## 2018-04-15 DIAGNOSIS — G5602 Carpal tunnel syndrome, left upper limb: Secondary | ICD-10-CM | POA: Diagnosis not present

## 2018-04-29 DIAGNOSIS — G5601 Carpal tunnel syndrome, right upper limb: Secondary | ICD-10-CM | POA: Diagnosis not present

## 2018-04-29 DIAGNOSIS — G5602 Carpal tunnel syndrome, left upper limb: Secondary | ICD-10-CM | POA: Diagnosis not present

## 2018-04-29 DIAGNOSIS — Z9889 Other specified postprocedural states: Secondary | ICD-10-CM | POA: Diagnosis not present

## 2018-05-03 DIAGNOSIS — G5601 Carpal tunnel syndrome, right upper limb: Secondary | ICD-10-CM | POA: Diagnosis not present

## 2018-05-03 DIAGNOSIS — G5602 Carpal tunnel syndrome, left upper limb: Secondary | ICD-10-CM | POA: Diagnosis not present

## 2018-05-03 DIAGNOSIS — Z9889 Other specified postprocedural states: Secondary | ICD-10-CM | POA: Diagnosis not present

## 2018-05-06 DIAGNOSIS — Z9889 Other specified postprocedural states: Secondary | ICD-10-CM | POA: Diagnosis not present

## 2018-05-06 DIAGNOSIS — G5602 Carpal tunnel syndrome, left upper limb: Secondary | ICD-10-CM | POA: Diagnosis not present

## 2018-05-06 DIAGNOSIS — E559 Vitamin D deficiency, unspecified: Secondary | ICD-10-CM | POA: Diagnosis not present

## 2018-05-06 DIAGNOSIS — E782 Mixed hyperlipidemia: Secondary | ICD-10-CM | POA: Diagnosis not present

## 2018-05-06 DIAGNOSIS — Z79899 Other long term (current) drug therapy: Secondary | ICD-10-CM | POA: Diagnosis not present

## 2018-05-06 DIAGNOSIS — G5601 Carpal tunnel syndrome, right upper limb: Secondary | ICD-10-CM | POA: Diagnosis not present

## 2018-05-06 DIAGNOSIS — I10 Essential (primary) hypertension: Secondary | ICD-10-CM | POA: Diagnosis not present

## 2018-05-08 DIAGNOSIS — I10 Essential (primary) hypertension: Secondary | ICD-10-CM | POA: Diagnosis not present

## 2018-05-08 DIAGNOSIS — E559 Vitamin D deficiency, unspecified: Secondary | ICD-10-CM | POA: Diagnosis not present

## 2018-05-08 DIAGNOSIS — Z6828 Body mass index (BMI) 28.0-28.9, adult: Secondary | ICD-10-CM | POA: Diagnosis not present

## 2018-05-08 DIAGNOSIS — Z23 Encounter for immunization: Secondary | ICD-10-CM | POA: Diagnosis not present

## 2018-05-08 DIAGNOSIS — E782 Mixed hyperlipidemia: Secondary | ICD-10-CM | POA: Diagnosis not present

## 2018-05-10 DIAGNOSIS — G5602 Carpal tunnel syndrome, left upper limb: Secondary | ICD-10-CM | POA: Diagnosis not present

## 2018-05-10 DIAGNOSIS — G5601 Carpal tunnel syndrome, right upper limb: Secondary | ICD-10-CM | POA: Diagnosis not present

## 2018-05-10 DIAGNOSIS — Z9889 Other specified postprocedural states: Secondary | ICD-10-CM | POA: Diagnosis not present

## 2018-05-14 DIAGNOSIS — Z9889 Other specified postprocedural states: Secondary | ICD-10-CM | POA: Diagnosis not present

## 2018-05-14 DIAGNOSIS — G5602 Carpal tunnel syndrome, left upper limb: Secondary | ICD-10-CM | POA: Diagnosis not present

## 2018-05-14 DIAGNOSIS — G5601 Carpal tunnel syndrome, right upper limb: Secondary | ICD-10-CM | POA: Diagnosis not present

## 2018-05-17 DIAGNOSIS — Z9889 Other specified postprocedural states: Secondary | ICD-10-CM | POA: Diagnosis not present

## 2018-05-17 DIAGNOSIS — G5602 Carpal tunnel syndrome, left upper limb: Secondary | ICD-10-CM | POA: Diagnosis not present

## 2018-05-17 DIAGNOSIS — G5601 Carpal tunnel syndrome, right upper limb: Secondary | ICD-10-CM | POA: Diagnosis not present

## 2018-05-21 DIAGNOSIS — G5601 Carpal tunnel syndrome, right upper limb: Secondary | ICD-10-CM | POA: Diagnosis not present

## 2018-05-21 DIAGNOSIS — G5602 Carpal tunnel syndrome, left upper limb: Secondary | ICD-10-CM | POA: Diagnosis not present

## 2018-05-21 DIAGNOSIS — Z9889 Other specified postprocedural states: Secondary | ICD-10-CM | POA: Diagnosis not present

## 2018-05-24 DIAGNOSIS — Z9889 Other specified postprocedural states: Secondary | ICD-10-CM | POA: Diagnosis not present

## 2018-05-24 DIAGNOSIS — G5602 Carpal tunnel syndrome, left upper limb: Secondary | ICD-10-CM | POA: Diagnosis not present

## 2018-05-24 DIAGNOSIS — G5601 Carpal tunnel syndrome, right upper limb: Secondary | ICD-10-CM | POA: Diagnosis not present

## 2018-05-28 DIAGNOSIS — G5602 Carpal tunnel syndrome, left upper limb: Secondary | ICD-10-CM | POA: Diagnosis not present

## 2018-05-28 DIAGNOSIS — Z9889 Other specified postprocedural states: Secondary | ICD-10-CM | POA: Diagnosis not present

## 2018-05-28 DIAGNOSIS — G5601 Carpal tunnel syndrome, right upper limb: Secondary | ICD-10-CM | POA: Diagnosis not present

## 2018-06-07 DIAGNOSIS — G5601 Carpal tunnel syndrome, right upper limb: Secondary | ICD-10-CM | POA: Diagnosis not present

## 2018-06-07 DIAGNOSIS — G5602 Carpal tunnel syndrome, left upper limb: Secondary | ICD-10-CM | POA: Diagnosis not present

## 2018-06-07 DIAGNOSIS — Z9889 Other specified postprocedural states: Secondary | ICD-10-CM | POA: Diagnosis not present

## 2018-06-14 DIAGNOSIS — G5602 Carpal tunnel syndrome, left upper limb: Secondary | ICD-10-CM | POA: Diagnosis not present

## 2018-06-14 DIAGNOSIS — Z9889 Other specified postprocedural states: Secondary | ICD-10-CM | POA: Diagnosis not present

## 2018-06-14 DIAGNOSIS — G5601 Carpal tunnel syndrome, right upper limb: Secondary | ICD-10-CM | POA: Diagnosis not present

## 2018-06-26 ENCOUNTER — Other Ambulatory Visit: Payer: Self-pay

## 2018-07-18 DIAGNOSIS — N390 Urinary tract infection, site not specified: Secondary | ICD-10-CM | POA: Diagnosis not present

## 2018-07-18 DIAGNOSIS — Z6828 Body mass index (BMI) 28.0-28.9, adult: Secondary | ICD-10-CM | POA: Diagnosis not present

## 2018-07-18 DIAGNOSIS — R300 Dysuria: Secondary | ICD-10-CM | POA: Diagnosis not present

## 2018-07-25 DIAGNOSIS — N390 Urinary tract infection, site not specified: Secondary | ICD-10-CM | POA: Diagnosis not present

## 2018-07-25 DIAGNOSIS — Z6828 Body mass index (BMI) 28.0-28.9, adult: Secondary | ICD-10-CM | POA: Diagnosis not present

## 2018-09-19 DIAGNOSIS — H25093 Other age-related incipient cataract, bilateral: Secondary | ICD-10-CM | POA: Diagnosis not present

## 2018-11-11 DIAGNOSIS — Z79899 Other long term (current) drug therapy: Secondary | ICD-10-CM | POA: Diagnosis not present

## 2018-11-11 DIAGNOSIS — E559 Vitamin D deficiency, unspecified: Secondary | ICD-10-CM | POA: Diagnosis not present

## 2018-11-11 DIAGNOSIS — I10 Essential (primary) hypertension: Secondary | ICD-10-CM | POA: Diagnosis not present

## 2018-11-11 DIAGNOSIS — Z719 Counseling, unspecified: Secondary | ICD-10-CM | POA: Diagnosis not present

## 2018-11-13 IMAGING — CR DG CHEST 2V
2 series · 2 of 2 positions shown · non-contrast
Comparison: None.

CLINICAL DATA: Preop carpal tunnel surgery

EXAM:
CHEST  2 VIEW

[chest pa]
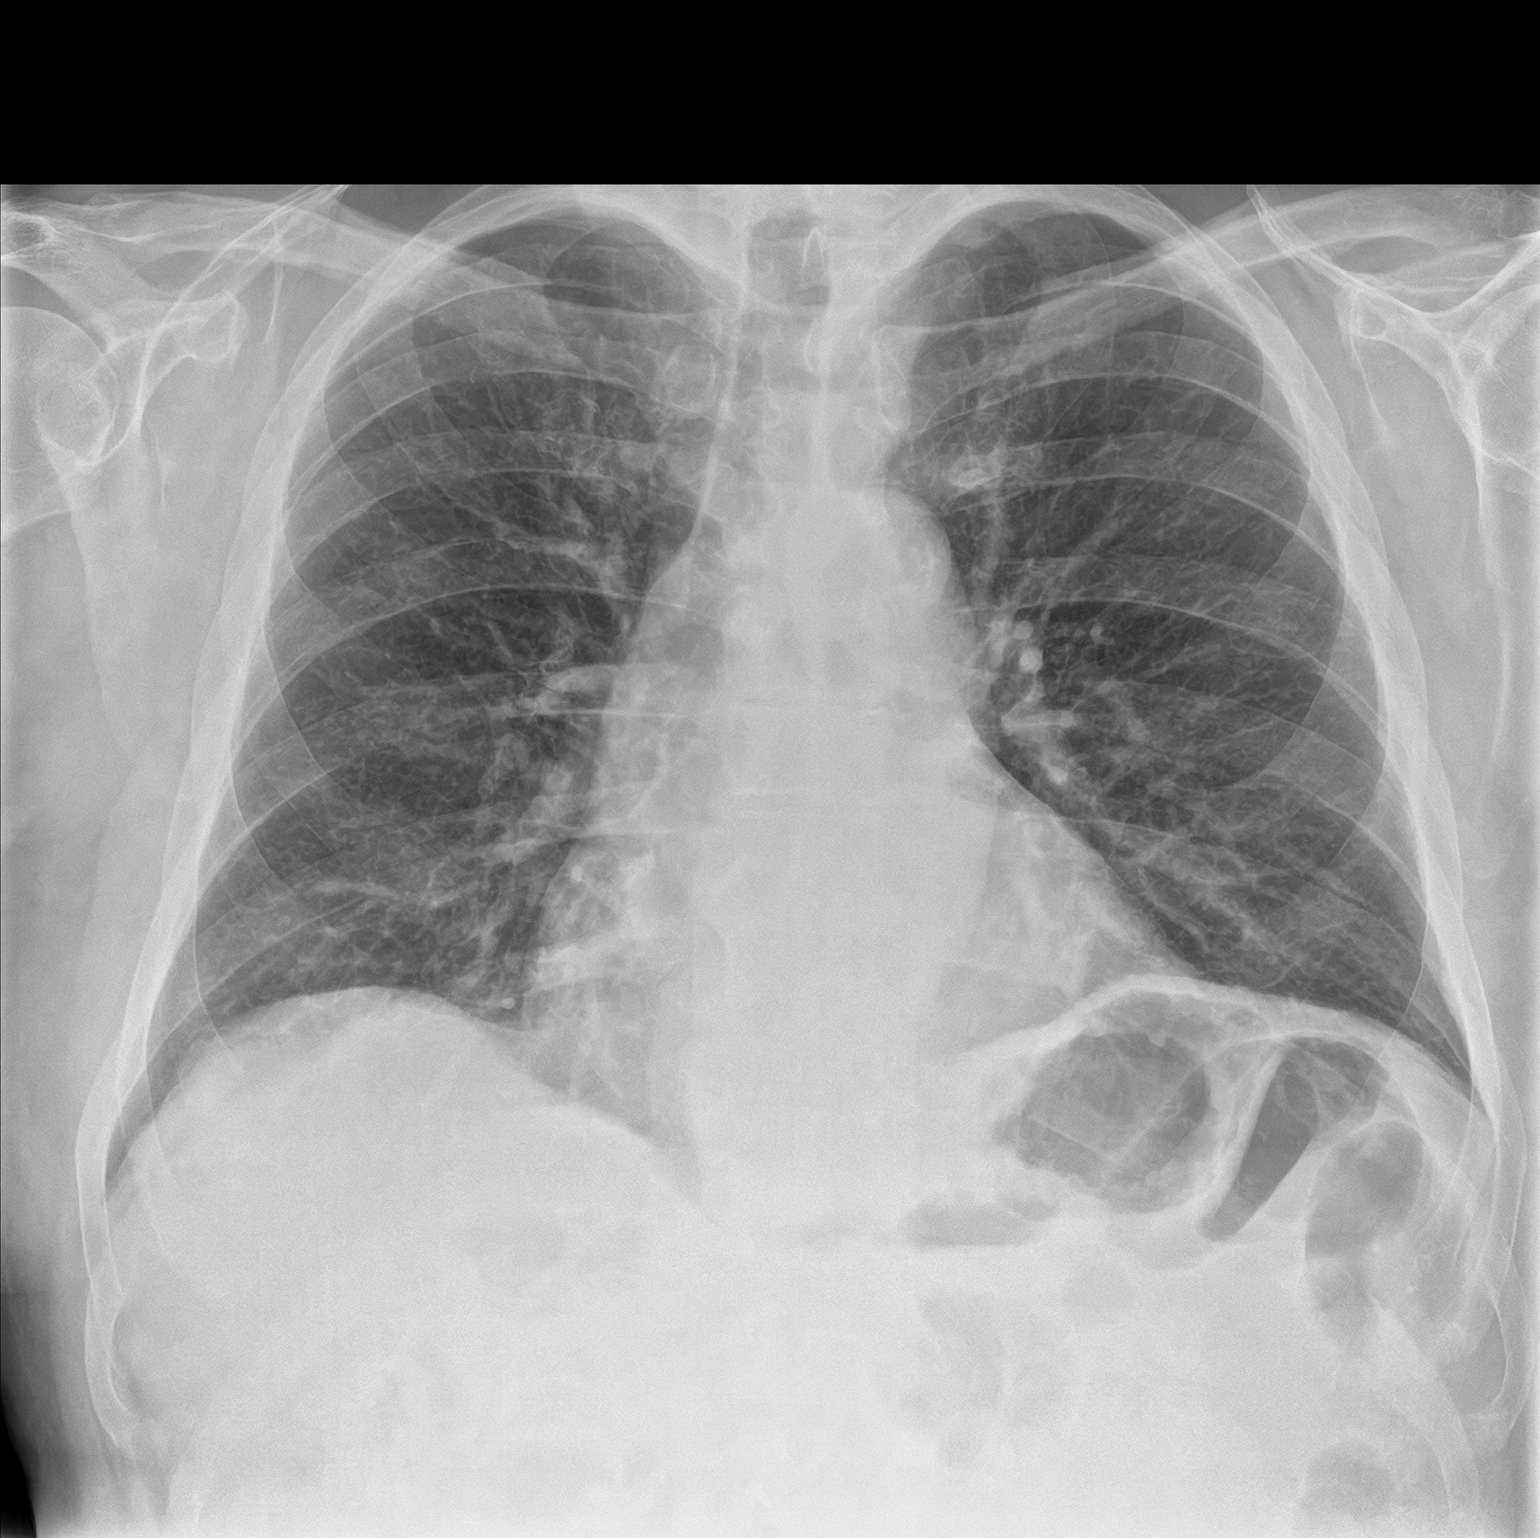

[chest lat]
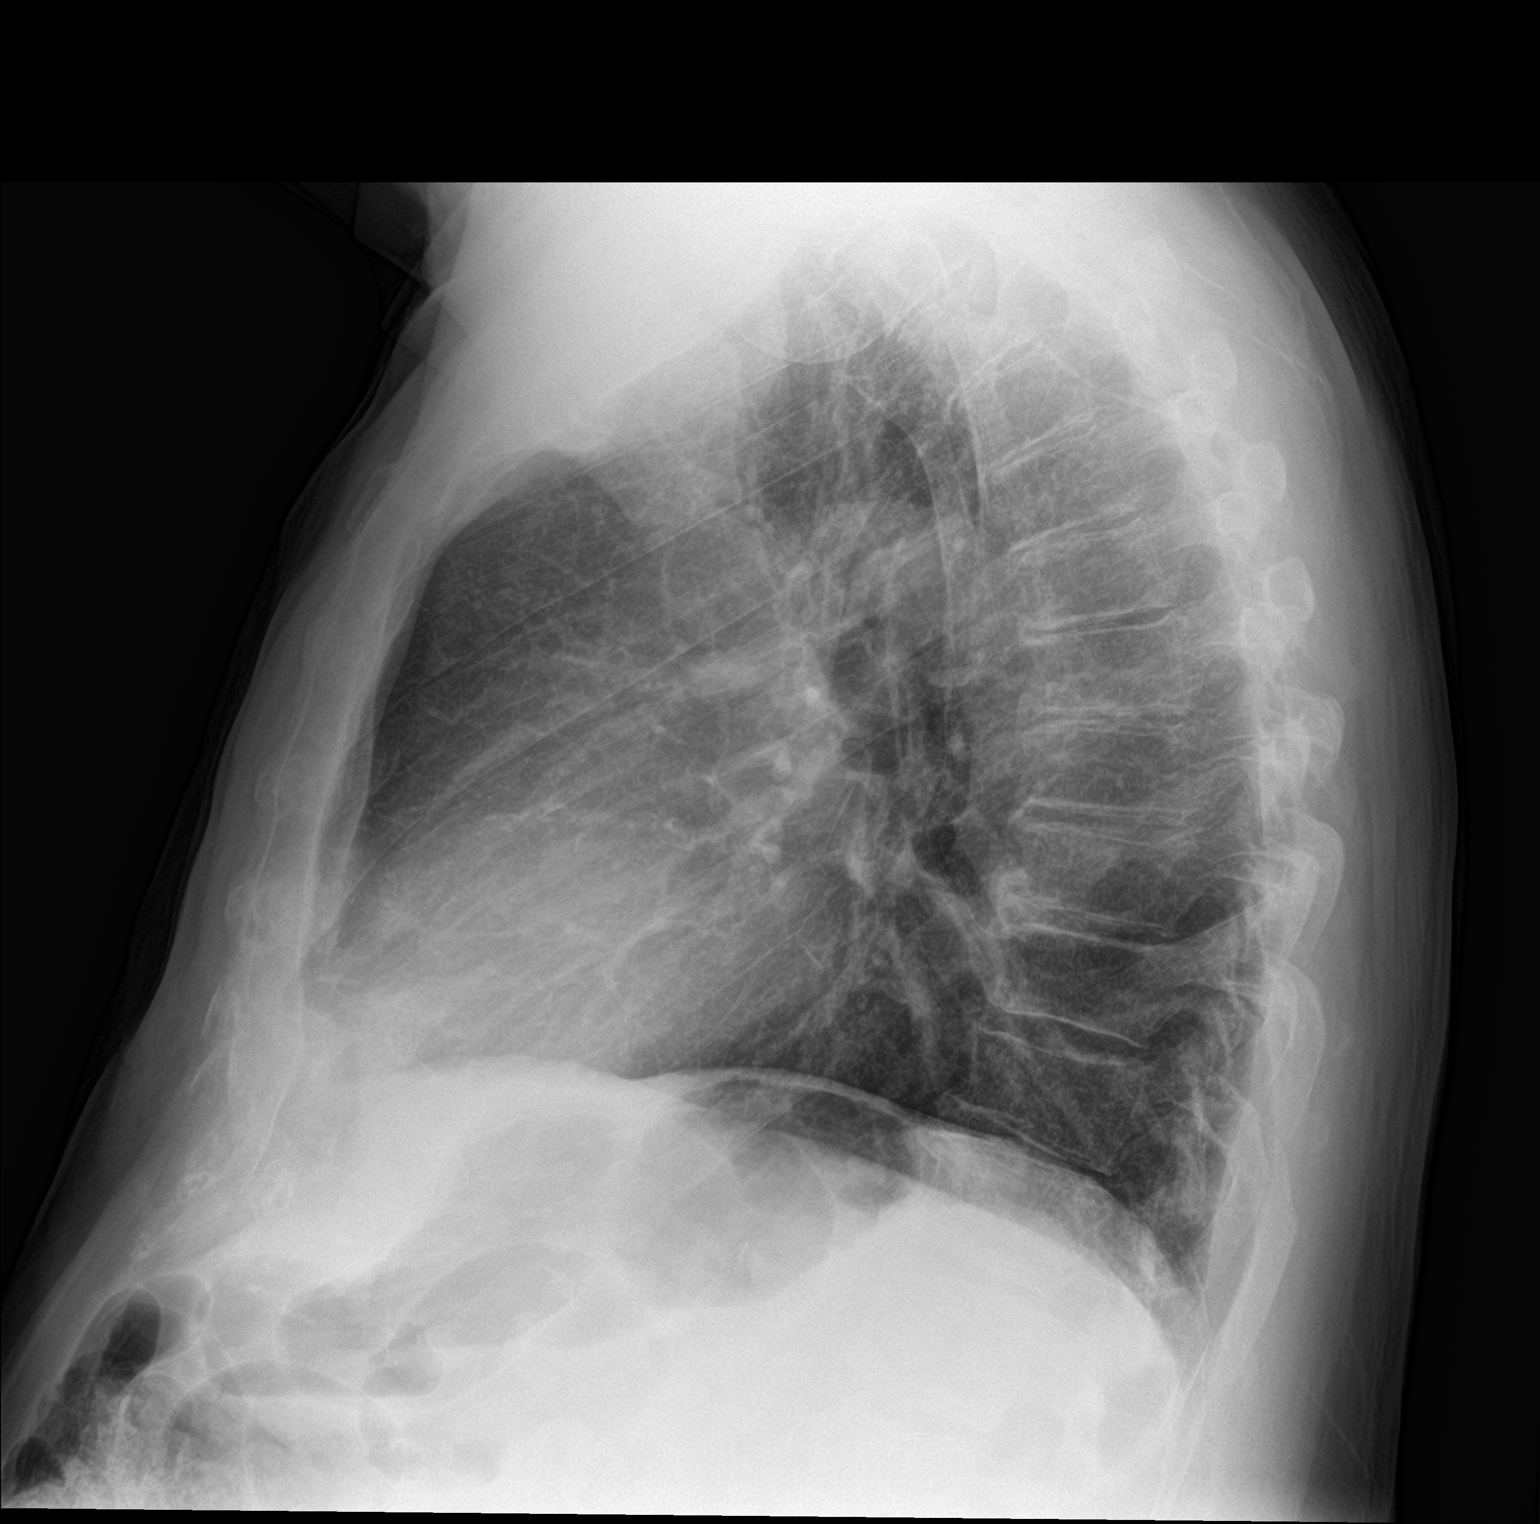

[2 of 2 positions shown; findings below may reference images not displayed]

FINDINGS: Heart and mediastinal contours are within normal limits. No focal
opacities or effusions. No acute bony abnormality.
IMPRESSION: No active cardiopulmonary disease.

## 2019-01-20 DIAGNOSIS — Z0000 Encounter for general adult medical examination without abnormal findings: Secondary | ICD-10-CM | POA: Diagnosis not present

## 2019-03-18 DIAGNOSIS — I10 Essential (primary) hypertension: Secondary | ICD-10-CM | POA: Diagnosis not present

## 2019-03-18 DIAGNOSIS — E559 Vitamin D deficiency, unspecified: Secondary | ICD-10-CM | POA: Diagnosis not present

## 2019-03-18 DIAGNOSIS — E782 Mixed hyperlipidemia: Secondary | ICD-10-CM | POA: Diagnosis not present

## 2019-03-31 DIAGNOSIS — J309 Allergic rhinitis, unspecified: Secondary | ICD-10-CM | POA: Diagnosis not present

## 2019-03-31 DIAGNOSIS — I10 Essential (primary) hypertension: Secondary | ICD-10-CM | POA: Diagnosis not present

## 2019-03-31 DIAGNOSIS — E785 Hyperlipidemia, unspecified: Secondary | ICD-10-CM | POA: Diagnosis not present

## 2019-03-31 DIAGNOSIS — Z6828 Body mass index (BMI) 28.0-28.9, adult: Secondary | ICD-10-CM | POA: Diagnosis not present

## 2019-03-31 DIAGNOSIS — Z9889 Other specified postprocedural states: Secondary | ICD-10-CM | POA: Diagnosis not present

## 2019-03-31 DIAGNOSIS — E559 Vitamin D deficiency, unspecified: Secondary | ICD-10-CM | POA: Diagnosis not present

## 2019-04-15 DIAGNOSIS — R0982 Postnasal drip: Secondary | ICD-10-CM | POA: Diagnosis not present

## 2019-04-15 DIAGNOSIS — Z0000 Encounter for general adult medical examination without abnormal findings: Secondary | ICD-10-CM | POA: Diagnosis not present

## 2019-04-15 DIAGNOSIS — N481 Balanitis: Secondary | ICD-10-CM | POA: Diagnosis not present

## 2019-04-15 DIAGNOSIS — H6123 Impacted cerumen, bilateral: Secondary | ICD-10-CM | POA: Diagnosis not present

## 2019-04-25 DIAGNOSIS — H6123 Impacted cerumen, bilateral: Secondary | ICD-10-CM | POA: Diagnosis not present

## 2019-04-25 DIAGNOSIS — K429 Umbilical hernia without obstruction or gangrene: Secondary | ICD-10-CM | POA: Diagnosis not present

## 2019-04-25 DIAGNOSIS — Z23 Encounter for immunization: Secondary | ICD-10-CM | POA: Diagnosis not present

## 2019-05-05 DIAGNOSIS — H6121 Impacted cerumen, right ear: Secondary | ICD-10-CM | POA: Diagnosis not present

## 2019-06-02 DIAGNOSIS — L821 Other seborrheic keratosis: Secondary | ICD-10-CM | POA: Diagnosis not present

## 2019-06-02 DIAGNOSIS — D485 Neoplasm of uncertain behavior of skin: Secondary | ICD-10-CM | POA: Diagnosis not present

## 2019-06-02 DIAGNOSIS — D225 Melanocytic nevi of trunk: Secondary | ICD-10-CM | POA: Diagnosis not present

## 2019-06-02 DIAGNOSIS — D0359 Melanoma in situ of other part of trunk: Secondary | ICD-10-CM | POA: Diagnosis not present

## 2019-06-02 DIAGNOSIS — L570 Actinic keratosis: Secondary | ICD-10-CM | POA: Diagnosis not present

## 2019-06-02 DIAGNOSIS — L814 Other melanin hyperpigmentation: Secondary | ICD-10-CM | POA: Diagnosis not present

## 2019-06-02 DIAGNOSIS — Z23 Encounter for immunization: Secondary | ICD-10-CM | POA: Diagnosis not present

## 2019-06-02 DIAGNOSIS — Z86018 Personal history of other benign neoplasm: Secondary | ICD-10-CM | POA: Diagnosis not present

## 2019-06-02 DIAGNOSIS — C44319 Basal cell carcinoma of skin of other parts of face: Secondary | ICD-10-CM | POA: Diagnosis not present

## 2019-06-05 DIAGNOSIS — D0359 Melanoma in situ of other part of trunk: Secondary | ICD-10-CM | POA: Diagnosis not present

## 2019-06-05 DIAGNOSIS — L905 Scar conditions and fibrosis of skin: Secondary | ICD-10-CM | POA: Diagnosis not present

## 2019-07-01 ENCOUNTER — Other Ambulatory Visit: Payer: Self-pay

## 2019-11-17 DIAGNOSIS — C44319 Basal cell carcinoma of skin of other parts of face: Secondary | ICD-10-CM | POA: Diagnosis not present

## 2019-12-17 DIAGNOSIS — L578 Other skin changes due to chronic exposure to nonionizing radiation: Secondary | ICD-10-CM | POA: Diagnosis not present

## 2019-12-17 DIAGNOSIS — L820 Inflamed seborrheic keratosis: Secondary | ICD-10-CM | POA: Diagnosis not present

## 2019-12-17 DIAGNOSIS — D225 Melanocytic nevi of trunk: Secondary | ICD-10-CM | POA: Diagnosis not present

## 2019-12-17 DIAGNOSIS — L570 Actinic keratosis: Secondary | ICD-10-CM | POA: Diagnosis not present

## 2019-12-17 DIAGNOSIS — I872 Venous insufficiency (chronic) (peripheral): Secondary | ICD-10-CM | POA: Diagnosis not present

## 2019-12-17 DIAGNOSIS — L821 Other seborrheic keratosis: Secondary | ICD-10-CM | POA: Diagnosis not present

## 2019-12-17 DIAGNOSIS — Z86006 Personal history of melanoma in-situ: Secondary | ICD-10-CM | POA: Diagnosis not present

## 2019-12-17 DIAGNOSIS — L814 Other melanin hyperpigmentation: Secondary | ICD-10-CM | POA: Diagnosis not present

## 2019-12-17 DIAGNOSIS — Z86018 Personal history of other benign neoplasm: Secondary | ICD-10-CM | POA: Diagnosis not present

## 2020-04-14 DIAGNOSIS — E785 Hyperlipidemia, unspecified: Secondary | ICD-10-CM | POA: Diagnosis not present

## 2020-04-14 DIAGNOSIS — I10 Essential (primary) hypertension: Secondary | ICD-10-CM | POA: Diagnosis not present

## 2020-04-19 DIAGNOSIS — Z0001 Encounter for general adult medical examination with abnormal findings: Secondary | ICD-10-CM | POA: Diagnosis not present

## 2020-04-19 DIAGNOSIS — H6121 Impacted cerumen, right ear: Secondary | ICD-10-CM | POA: Diagnosis not present

## 2020-04-19 DIAGNOSIS — Z23 Encounter for immunization: Secondary | ICD-10-CM | POA: Diagnosis not present

## 2020-05-25 DIAGNOSIS — Z23 Encounter for immunization: Secondary | ICD-10-CM | POA: Diagnosis not present

## 2020-05-30 DIAGNOSIS — Z23 Encounter for immunization: Secondary | ICD-10-CM | POA: Diagnosis not present

## 2020-06-01 DIAGNOSIS — N481 Balanitis: Secondary | ICD-10-CM | POA: Diagnosis not present

## 2020-06-01 DIAGNOSIS — N471 Phimosis: Secondary | ICD-10-CM | POA: Diagnosis not present

## 2020-06-17 DIAGNOSIS — D485 Neoplasm of uncertain behavior of skin: Secondary | ICD-10-CM | POA: Diagnosis not present

## 2020-06-17 DIAGNOSIS — D225 Melanocytic nevi of trunk: Secondary | ICD-10-CM | POA: Diagnosis not present

## 2020-06-17 DIAGNOSIS — L820 Inflamed seborrheic keratosis: Secondary | ICD-10-CM | POA: Diagnosis not present

## 2020-06-17 DIAGNOSIS — L578 Other skin changes due to chronic exposure to nonionizing radiation: Secondary | ICD-10-CM | POA: Diagnosis not present

## 2020-06-17 DIAGNOSIS — Z86018 Personal history of other benign neoplasm: Secondary | ICD-10-CM | POA: Diagnosis not present

## 2020-06-17 DIAGNOSIS — L821 Other seborrheic keratosis: Secondary | ICD-10-CM | POA: Diagnosis not present

## 2020-06-17 DIAGNOSIS — L570 Actinic keratosis: Secondary | ICD-10-CM | POA: Diagnosis not present

## 2020-06-17 DIAGNOSIS — L7211 Pilar cyst: Secondary | ICD-10-CM | POA: Diagnosis not present

## 2020-06-17 DIAGNOSIS — L814 Other melanin hyperpigmentation: Secondary | ICD-10-CM | POA: Diagnosis not present

## 2020-06-17 DIAGNOSIS — Z86006 Personal history of melanoma in-situ: Secondary | ICD-10-CM | POA: Diagnosis not present

## 2020-07-26 DIAGNOSIS — Z23 Encounter for immunization: Secondary | ICD-10-CM | POA: Diagnosis not present

## 2020-10-13 ENCOUNTER — Encounter: Payer: Self-pay | Admitting: Urology

## 2020-10-13 ENCOUNTER — Ambulatory Visit (INDEPENDENT_AMBULATORY_CARE_PROVIDER_SITE_OTHER): Payer: Medicare Other | Admitting: Urology

## 2020-10-13 ENCOUNTER — Other Ambulatory Visit: Payer: Self-pay

## 2020-10-13 VITALS — BP 166/92 | HR 95 | Ht 71.0 in | Wt 219.0 lb

## 2020-10-13 DIAGNOSIS — N471 Phimosis: Secondary | ICD-10-CM

## 2020-10-13 DIAGNOSIS — N481 Balanitis: Secondary | ICD-10-CM

## 2020-10-13 MED ORDER — NYSTATIN-TRIAMCINOLONE 100000-0.1 UNIT/GM-% EX OINT: 1.0000 "application " | TOPICAL_OINTMENT | Freq: Two times a day (BID) | CUTANEOUS | 2 refills | Status: AC

## 2020-10-13 NOTE — Patient Instructions (Signed)
Pfenniger and Fowler's Procedures for Primary Care (3rd ed., pp. [phone]). [place], [place]: [employer]."> Hinman's Atlas of Urological Surgery (4th ed., pp. [phone]). [place], [place]: Else[employer].">  Circumcision, Adult  Circumcision is a surgery to remove the foreskin of the penis or to cut the foreskin so the space between the skin and tip of the penis is larger. When the foreskin is cut but not removed, the procedure is called a dorsal incision or dorsal circumcision. A dorsal circumcision leaves the entire foreskin but makes the end of the foreskin looser so it can be pulled back over the head of the penis. Tell a health care provider about:  Any allergies you have.  All medicines you are taking, including vitamins, herbs, eye drops, creams, and over-the-counter medicines.  Any problems you or family members have had with anesthetic medicines.  Any blood disorders you have.  Any surgeries you have had.  Any medical conditions you have, including the common cold or another infection. What are the risks? Generally, this is a safe procedure. However, problems may occur, including:  Bleeding.  Infection.  Pain.  Allergic reactions to medicines.  Opening of the surgical wound. This can occur from an unwanted erection after surgery. What happens before the procedure? Staying hydrated Follow instructions from your health care provider about hydration, which may include:  Up to 2 hours before the procedure - you may continue to drink clear liquids, such as water, clear fruit juice, black coffee, and plain tea.   Eating and drinking restrictions Follow instructions from your health care provider about eating and drinking, which may include:  8 hours before the procedure - stop eating heavy meals or foods, such as meat, fried foods, or fatty foods.  6 hours before the procedure - stop eating light meals or foods, such as toast or cereal.  6 hours before the procedure - stop drinking milk  or drinks that contain milk.  2 hours before the procedure - stop drinking clear liquids. Medicines  Ask your health care provider about: ? Changing or stopping your regular medicines. This is especially important if you take diabetes medicines or blood thinners. ? Taking medicines such as aspirin and ibuprofen. These medicines can thin your blood. Do not take these medicines unless your health care provider tells you to take them. ? Taking over-the-counter medicines, vitamins, herbs, and supplements. General instructions  Do not use any products that contain nicotine or tobacco for at least 4 weeks before the procedure. These products include cigarettes, e-cigarettes, and chewing tobacco. If you need help quitting, ask your health care provider.  Plan to have someone take you home from the hospital or clinic.  If you will be going home right after the procedure, plan to have someone with you for 24 hours.  Ask your health care provider: ? How your surgery site will be marked. ? What steps will be taken to help prevent infection. These may include:  Washing skin with a germ-killing soap.  Taking antibiotic medicine.   What happens during the procedure?  An IV may be inserted into one of your veins.  You will be given one or more of the following: ? A medicine to help you relax (sedative). ? A medicine to numb the area (local anesthetic). This will be injected with a needle into the skin of your penis.  An incision will be made to remove or cut the foreskin.  Absorbable stitches (sutures) may be used to close the incision.  A bandage (dressing)  will be applied to the incision site.  The IV will be removed. The procedure may vary among health care providers and hospitals. What happens after the procedure?  Your blood pressure, heart rate, breathing rate, and blood oxygen level will be monitored until you leave the hospital or clinic.  Do not get out of bed until your health  care provider approves.  If you were given a sedative during the procedure, it can affect you for several hours. Do not drive or operate machinery until your health care provider says that it is safe. Summary  Circumcision is a surgery to remove the foreskin of the penis or to cut the foreskin so the space between the skin and tip of the penis is larger.  If you will be going home right after the procedure, plan to have someone with you for 24 hours.  Absorbable sutures may be used to close the incision after the foreskin has been removed or cut. This information is not intended to replace advice given to you by your health care provider. Make sure you discuss any questions you have with your health care provider. Document Revised: 05/28/2019 Document Reviewed: 05/28/2019 Elsevier Patient Education  [place].

## 2020-10-13 NOTE — Progress Notes (Signed)
10/13/2020 4:00 PM   Cole Barker Dec 21, 1935 [id number]  Referring provider: Oren Section, NP-C [place],  [place] [address]  Chief Complaint  Patient presents with  . BALANITIS    HPI: Pleasant 85 year old male who presents today for further evaluation of phimosis, recurrent balanoposthitis this.  He was seen and evaluated by his primary care physician about 6 months ago with difficulty retracting his foreskin as well as erythema and irritation of the foreskin and glans.  He was prescribed Keflex, p.o. Diflucan as well as a topical nystatin cream.  He had some improvement with this regimen but continues have some difficulty pulling back his foreskin.  He denies any urinary issues.  No personal history of his prior to about a year ago.  He is uncircumcised  Nondiabetic.   PMH: Past Medical History:  Diagnosis Date  . Hyperlipidemia   . Hypertension   . Neuromuscular disorder ([place])    neuropathy/corpal tunnel    Surgical History: Past Surgical History:  Procedure Laterality Date  . CARPAL TUNNEL RELEASE Right 09/17/2017   Procedure: CARPAL TUNNEL RELEASE;  Surgeon: [name], MD;  Location: [facility] ORS;  Service: Neurosurgery;  Laterality: Right;  . CARPAL TUNNEL RELEASE Left 10/29/2017   Procedure: CARPAL TUNNEL RELEASE;  Surgeon: [name], MD;  Location: [facility] ORS;  Service: Neurosurgery;  Laterality: Left;  . JOINT REPLACEMENT  2009   left    Home Medications:  Allergies as of 10/13/2020      Reactions   Adhesive [tape]    Burns, tears up skin   Other Other (See Comments), Rash   Blisters Burns, tears up skin      Medication List       Accurate as of October 13, 2020  4:00 PM. If you have any questions, ask your nurse or doctor.        amLODipine 10 MG tablet Commonly known as: NORVASC Take 10 mg by mouth daily.   atorvastatin 20 MG tablet Commonly known as: LIPITOR Take 10 mg by mouth daily. Pt takes 1/2 tablet    cetirizine 10 MG tablet Commonly known as: ZYRTEC Take 10 mg by mouth daily.   CHROMIUM GTF PO Take 1 tablet by mouth daily.   folic acid 456 MCG tablet Commonly known as: FOLVITE Take 800 mcg by mouth daily.   lisinopril 5 MG tablet Commonly known as: ZESTRIL Take 5 mg by mouth daily.   naproxen sodium 220 MG tablet Commonly known as: ALEVE Take 220 mg by mouth daily as needed (pain).   nystatin-triamcinolone ointment Commonly known as: MYCOLOG Apply 1 application topically 2 (two) times daily. Started by: Hollice Espy, MD   OVER THE COUNTER MEDICATION Take 2 tablets by mouth daily. everflex   OVER THE COUNTER MEDICATION Take 1 tablet by mouth daily. Coryza forte otc supplement   OVER THE COUNTER MEDICATION Take 2 tablets by mouth daily. Fenshi otc supplement   Potassium 99 MG Tabs Take 99 mg by mouth daily.   triamcinolone 0.1 % Commonly known as: KENALOG Apply 1 application topically 2 (two) times daily. For 14 days   Vitamins A & D 10000-400 units Tabs Take 1 tablet by mouth daily.       Allergies:  Allergies  Allergen Reactions  . Adhesive [Tape]     Burns, tears up skin  . Other Other (See Comments) and Rash    Blisters Burns, tears up skin    Family History: No family history on  file.  Social History:  reports that he has never smoked. He has never used smokeless tobacco. He reports current alcohol use. He reports that he does not use drugs.   Physical Exam: BP (!) 166/92   Pulse 95   Ht 5\' 11"  (1.803 m)   Wt 219 lb (99.3 kg)   BMI 30.54 kg/m   Constitutional:  Alert and oriented, No acute distress. HEENT: [place] AT, moist mucus membranes.  Trachea midline, no masses. Cardiovascular: No clubbing, cyanosis, or edema. Respiratory: Normal respiratory effort, no increased work of breathing. GU: Bilateral descended testicles without masses, possible left cord lipoma.  Uncircumcised phallus without significant erythema or skin changes of the  glans or prepuce.  His foreskin is slightly tight but able to retract it fully without debris or significant inflammation.  Orthotopic meatus which is patent. Skin: No rashes, bruises or suspicious lesions. Neurologic: Grossly intact, no focal deficits, moving all 4 extremities. Psychiatric: Normal mood and affect.   Assessment & Plan:    1. Phimosis Phimotic ring which is not particularly severe, tight but able to retract foreskin  No significant inflammation today to suggest bone of prosthesis or balanitis at this time.  We discussed alternatives including the addition of a topical steroid cream to his antifungal regimen.loosen the skin along with daily retraction and hygiene for the next month.  Alternatives, including circumcision were also discussed at length.  Risk of this including risk of bleeding, infection, penile sensitivity, impaired cosmesis amongst others were discussed.  He like to hold off on circumcision for now but will let us know if things fail to improve or worsen and he like to pursue this.  Meds ordered this encounter  Medications  . nystatin-triamcinolone ointment (MYCOLOG)    Sig: Apply 1 application topically 2 (two) times daily.    Dispense:  30 g    Refill:  [place], MD  [facility] [address], [place], [place] [address] [phone]

## 2020-10-15 LAB — URINALYSIS, COMPLETE
Bilirubin, UA: NEGATIVE
Glucose, UA: NEGATIVE
Ketones, UA: NEGATIVE
Leukocytes,UA: NEGATIVE
Nitrite, UA: NEGATIVE
Protein,UA: NEGATIVE
RBC, UA: NEGATIVE
Specific Gravity, UA: 1.025 (ref 1.005–1.030)
Urobilinogen, Ur: 1 mg/dL (ref 0.2–1.0)
pH, UA: 5.5 (ref 5.0–7.5)

## 2020-10-15 LAB — MICROSCOPIC EXAMINATION
Bacteria, UA: NONE SEEN
RBC: NONE SEEN /hpf (ref 0–2)

## 2020-11-11 DIAGNOSIS — Z23 Encounter for immunization: Secondary | ICD-10-CM | POA: Diagnosis not present

## 2020-12-20 DIAGNOSIS — I10 Essential (primary) hypertension: Secondary | ICD-10-CM | POA: Diagnosis not present

## 2020-12-20 DIAGNOSIS — L814 Other melanin hyperpigmentation: Secondary | ICD-10-CM | POA: Diagnosis not present

## 2020-12-20 DIAGNOSIS — D485 Neoplasm of uncertain behavior of skin: Secondary | ICD-10-CM | POA: Diagnosis not present

## 2020-12-20 DIAGNOSIS — D225 Melanocytic nevi of trunk: Secondary | ICD-10-CM | POA: Diagnosis not present

## 2020-12-20 DIAGNOSIS — Z86018 Personal history of other benign neoplasm: Secondary | ICD-10-CM | POA: Diagnosis not present

## 2020-12-20 DIAGNOSIS — L821 Other seborrheic keratosis: Secondary | ICD-10-CM | POA: Diagnosis not present

## 2020-12-20 DIAGNOSIS — L578 Other skin changes due to chronic exposure to nonionizing radiation: Secondary | ICD-10-CM | POA: Diagnosis not present

## 2020-12-20 DIAGNOSIS — L570 Actinic keratosis: Secondary | ICD-10-CM | POA: Diagnosis not present

## 2020-12-20 DIAGNOSIS — Z86006 Personal history of melanoma in-situ: Secondary | ICD-10-CM | POA: Diagnosis not present

## 2021-04-05 DIAGNOSIS — M25571 Pain in right ankle and joints of right foot: Secondary | ICD-10-CM | POA: Diagnosis not present

## 2021-04-05 DIAGNOSIS — G8929 Other chronic pain: Secondary | ICD-10-CM | POA: Diagnosis not present

## 2021-04-05 DIAGNOSIS — M25551 Pain in right hip: Secondary | ICD-10-CM | POA: Diagnosis not present

## 2021-04-26 DIAGNOSIS — Z23 Encounter for immunization: Secondary | ICD-10-CM | POA: Diagnosis not present

## 2021-06-01 ENCOUNTER — Encounter

## 2021-06-01 DIAGNOSIS — Z96642 Presence of left artificial hip joint: Secondary | ICD-10-CM | POA: Diagnosis not present

## 2021-06-01 DIAGNOSIS — M1611 Unilateral primary osteoarthritis, right hip: Secondary | ICD-10-CM | POA: Diagnosis not present

## 2021-06-01 DIAGNOSIS — M25551 Pain in right hip: Secondary | ICD-10-CM | POA: Diagnosis not present

## 2021-06-01 LAB — CBC WITH AUTO DIFFERENTIAL
Basophils %: 1 % (ref 0–2)
Basophils Absolute: 0 10*3/uL (ref 0.0–0.1)
Eosinophils %: 1 % (ref 0–5)
Eosinophils Absolute: 0 10*3/uL (ref 0.0–0.4)
Granulocyte Absolute Count: 0 10*3/uL (ref 0.00–0.04)
Hematocrit: 44.3 % (ref 36.0–48.0)
Hemoglobin: 15.3 g/dL (ref 13.0–16.0)
Immature Granulocytes: 0 % (ref 0.0–0.5)
Lymphocytes %: 28 % (ref 21–52)
Lymphocytes Absolute: 1 10*3/uL (ref 0.9–3.6)
MCH: 33.4 PG (ref 24.0–34.0)
MCHC: 34.5 g/dL (ref 31.0–37.0)
MCV: 96.7 FL (ref 78.0–100.0)
MPV: 10.9 FL (ref 9.2–11.8)
Monocytes %: 16 % — ABNORMAL HIGH (ref 3–10)
Monocytes Absolute: 0.6 10*3/uL (ref 0.05–1.2)
NRBC Absolute: 0 10*3/uL (ref 0.00–0.01)
Neutrophils %: 54 % (ref 40–73)
Neutrophils Absolute: 2 10*3/uL (ref 1.8–8.0)
Nucleated RBCs: 0 PER 100 WBC
Platelets: 148 10*3/uL (ref 135–420)
RBC: 4.58 M/uL (ref 4.35–5.65)
RDW: 12.2 % (ref 11.6–14.5)
WBC: 3.7 10*3/uL — ABNORMAL LOW (ref 4.6–13.2)

## 2021-06-01 LAB — COMPREHENSIVE METABOLIC PANEL
ALT: 30 U/L (ref 16–61)
AST: 21 U/L (ref 10–38)
Albumin/Globulin Ratio: 1.9 — ABNORMAL HIGH (ref 0.8–1.7)
Albumin: 4.2 g/dL (ref 3.4–5.0)
Alkaline Phosphatase: 68 U/L (ref 45–117)
Anion Gap: 4 mmol/L (ref 3.0–18)
BUN: 21 MG/DL — ABNORMAL HIGH (ref 7.0–18)
Bun/Cre Ratio: 25 — ABNORMAL HIGH (ref 12–20)
CO2: 29 mmol/L (ref 21–32)
Calcium: 8.8 MG/DL (ref 8.5–10.1)
Chloride: 107 mmol/L (ref 100–111)
Creatinine: 0.85 MG/DL (ref 0.6–1.3)
ESTIMATED GLOMERULAR FILTRATION RATE: >60 mL/min/{1.73_m2} (ref >60)
Globulin: 2.2 g/dL (ref 2.0–4.0)
Glucose: 109 mg/dL — ABNORMAL HIGH (ref 74–99)
Potassium: 4.1 mmol/L (ref 3.5–5.5)
Sodium: 140 mmol/L (ref 136–145)
Total Bilirubin: 0.6 MG/DL (ref 0.2–1.0)
Total Protein: 6.4 g/dL (ref 6.4–8.2)

## 2021-06-01 LAB — URINALYSIS W/ RFLX MICROSCOPIC
Bilirubin, Urine: NEGATIVE
Bilirubin: NEGATIVE
Blood, Urine: NEGATIVE
Blood: NEGATIVE
Glucose, Ur: NEGATIVE mg/dL
Glucose: NEGATIVE mg/dL
Nitrite, Urine: NEGATIVE
Nitrites: NEGATIVE
Protein, UA: NEGATIVE mg/dL
Protein: NEGATIVE mg/dL
Specific Gravity, UA: 1.022 (ref 1.005–1.030)
Specific gravity: 1.022 (ref 1.005–1.030)
Urobilinogen, UA, POCT: 1 EU/dL (ref 0.2–1.0)
Urobilinogen: 1 EU/dL (ref 0.2–1.0)
pH (UA): 6.5 (ref 5.0–8.0)
pH, UA: 6.5 (ref 5.0–8.0)

## 2021-06-01 LAB — PROTIME-INR
INR: 1 (ref 0.8–1.2)
Protime: 13.9 s (ref 11.5–15.2)

## 2021-06-01 LAB — APTT: aPTT: 26.6 s (ref 23.0–36.4)

## 2021-06-01 LAB — URINE MICROSCOPIC ONLY
Epithelial Cells, UA: NEGATIVE /lpf (ref 0–5)
Epithelial cells: NEGATIVE /lpf (ref 0–5)
RBC, UA: 0 /hpf (ref 0–5)
RBC: 0 /hpf (ref 0–5)
WBC, UA: 0 /hpf (ref 0–5)
WBC: 0 /hpf (ref 0–5)

## 2021-06-01 LAB — HEMOGLOBIN A1C W/EAG
Hemoglobin A1C: 5.2 % (ref 4.2–5.6)
eAG: 103 mg/dL

## 2021-06-01 LAB — SEDIMENTATION RATE: Sed Rate: 1 mm/hr (ref 0–20)

## 2021-06-01 LAB — METABOLIC PANEL, COMPREHENSIVE
A-G Ratio: 1.9 — ABNORMAL HIGH (ref 0.8–1.7)
ALT (SGPT): 30 U/L (ref 16–61)
AST (SGOT): 21 U/L (ref 10–38)
Albumin: 4.2 g/dL (ref 3.4–5.0)
Alk. phosphatase: 68 U/L (ref 45–117)
Anion gap: 4 mmol/L (ref 3.0–18)
BUN/Creatinine ratio: 25 — ABNORMAL HIGH (ref 12–20)
BUN: 21 MG/DL — ABNORMAL HIGH (ref 7.0–18)
Bilirubin, total: 0.6 MG/DL (ref 0.2–1.0)
CO2: 29 mmol/L (ref 21–32)
Calcium: 8.8 MG/DL (ref 8.5–10.1)
Chloride: 107 mmol/L (ref 100–111)
Creatinine: 0.85 MG/DL (ref 0.6–1.3)
Globulin: 2.2 g/dL (ref 2.0–4.0)
Glucose: 109 mg/dL — ABNORMAL HIGH (ref 74–99)
Potassium: 4.1 mmol/L (ref 3.5–5.5)
Protein, total: 6.4 g/dL (ref 6.4–8.2)
Sodium: 140 mmol/L (ref 136–145)
eGFR: >60 mL/min/{1.73_m2} (ref >60)

## 2021-06-01 LAB — CBC WITH AUTOMATED DIFF
ABS. BASOPHILS: 0 10*3/uL (ref 0.0–0.1)
ABS. EOSINOPHILS: 0 10*3/uL (ref 0.0–0.4)
ABS. IMM. GRANS.: 0 10*3/uL (ref 0.00–0.04)
ABS. LYMPHOCYTES: 1 10*3/uL (ref 0.9–3.6)
ABS. MONOCYTES: 0.6 10*3/uL (ref 0.05–1.2)
ABS. NEUTROPHILS: 2 10*3/uL (ref 1.8–8.0)
ABSOLUTE NRBC: 0 10*3/uL (ref 0.00–0.01)
BASOPHILS: 1 % (ref 0–2)
EOSINOPHILS: 1 % (ref 0–5)
HCT: 44.3 % (ref 36.0–48.0)
HGB: 15.3 g/dL (ref 13.0–16.0)
IMMATURE GRANULOCYTES: 0 % (ref 0.0–0.5)
LYMPHOCYTES: 28 % (ref 21–52)
MCH: 33.4 PG (ref 24.0–34.0)
MCHC: 34.5 g/dL (ref 31.0–37.0)
MCV: 96.7 FL (ref 78.0–100.0)
MONOCYTES: 16 % — ABNORMAL HIGH (ref 3–10)
MPV: 10.9 FL (ref 9.2–11.8)
NEUTROPHILS: 54 % (ref 40–73)
NRBC: 0 PER 100 WBC
PLATELET: 148 10*3/uL (ref 135–420)
RBC: 4.58 M/uL (ref 4.35–5.65)
RDW: 12.2 % (ref 11.6–14.5)
WBC: 3.7 10*3/uL — ABNORMAL LOW (ref 4.6–13.2)

## 2021-06-01 LAB — HEMOGLOBIN A1C WITH EAG
Est. average glucose: 103 mg/dL
Hemoglobin A1c: 5.2 % (ref 4.2–5.6)

## 2021-06-01 LAB — SED RATE (ESR): Sed rate, automated: 1 mm/hr (ref 0–20)

## 2021-06-01 LAB — PTT: aPTT: 26.6 s (ref 23.0–36.4)

## 2021-06-01 LAB — PROTHROMBIN TIME + INR
INR: 1 (ref 0.8–1.2)
Prothrombin time: 13.9 s (ref 11.5–15.2)

## 2021-06-02 LAB — EKG 12-LEAD
Atrial Rate: 68 {beats}/min
P Axis: 35 degrees
P-R Interval: 164 ms
Q-T Interval: 406 ms
QRS Duration: 94 ms
QTc Calculation (Bazett): 431 ms
R Axis: 9 degrees
T Axis: 32 degrees
Ventricular Rate: 68 {beats}/min

## 2021-06-02 LAB — EKG, 12 LEAD, INITIAL
Atrial Rate: 68 {beats}/min
Calculated P Axis: 35 degrees
Calculated R Axis: 9 degrees
Calculated T Axis: 32 degrees
P-R Interval: 164 ms
Q-T Interval: 406 ms
QRS Duration: 94 ms
QTC Calculation (Bezet): 431 ms
Ventricular Rate: 68 {beats}/min

## 2021-06-03 DIAGNOSIS — M1611 Unilateral primary osteoarthritis, right hip: Secondary | ICD-10-CM | POA: Diagnosis not present

## 2021-06-03 DIAGNOSIS — Z96642 Presence of left artificial hip joint: Secondary | ICD-10-CM | POA: Diagnosis not present

## 2021-06-03 LAB — CULTURE, MRSA

## 2021-06-13 DIAGNOSIS — Z20822 Contact with and (suspected) exposure to covid-19: Secondary | ICD-10-CM | POA: Diagnosis not present

## 2021-06-15 NOTE — Interval H&P Note (Signed)
 PAT - SURGICAL PRE-ADMISSION INSTRUCTIONS    NAME:  Jesus Arellano                                                          TODAY'S DATE:  06/15/2021    SURGERY DATE:  06/27/2021                                  SURGERY ARRIVAL TIME:   TBD    Do NOT eat or drink anything, including candy or gum, after MIDNIGHT on 06/27/2021 , unless you have specific instructions from your Surgeon or Anesthesia Provider to do so.  No smoking 24 hours before surgery.  No alcohol 24 hours prior to the day of surgery.  No recreational drugs for one week prior to the day of surgery.  Leave all valuables, including money/purse, at home.  Remove all jewelry, nail polish, makeup (including mascara); no lotions, powders, deodorant, or perfume/cologne/after shave.  Glasses/Contact lenses and Dentures may be worn to the hospital.  They will be removed prior to surgery.  Call your doctor if symptoms of a cold or illness develop within 24 ours prior to surgery.  AN ADULT MUST DRIVE YOU HOME AFTER OUTPATIENT SURGERY.   If you are having an OUTPATIENT procedure, please make arrangements for a responsible adult to be with you for 24 hours after your surgery.  If you are admitted to the hospital, you will be assigned to a bed after surgery is complete.  Normally a family member will not be able to see you until you are in your assigned bed.  12. Visitation Restrictions Explained.  Preoperative Nutrition Screen (PONS)   Patient's Age: 85 y.o.    Patient's BMI: Estimated body mass index is 27.31 kg/m as calculated from the following:    Height as of this encounter: 5' 11.75 (1.822 m).    Weight as of this encounter: 90.7 kg (200 lb).     If the answer to any of the following is Yes, then recommend prescribe Oral Nutrition Supplements (ONS) for at least 7 days prior to surgery and/or order referral to dietitian for further assessment and nutrition therapy.    1. Does the patient have a documented serum albumin less than 3.0 within the last 90 days?    No = 0        2. Is patient's BMI less than 18.5 (or less than 20 if age over 96)?  No = 0      3. Has the patient had an unplanned weight loss of 10% of body weight or more in the last 6 months? No = 0   4. Has the patient been eating less than 50% of their normal diet in the preceding week? No = 0   PONS Score (number of Yes responses), 0-4 0     Plan:   No Nutrition Intervention indicated    Pt has an advanced directive, will bring on dos  UA faxed to Dr. Debi office, pt denies s/s of UTI    Special Instructions:  Take amlodipine, atorvastatin on dos with sips of water  Do not take lisinopril on dos  Pt instructed to avoid NSAIDs 7 days prior to procedure per MDA.  Pt instructed to hold vitamins/supplements 2 weeks prior to procedure per MDA.     Patient Prep:  use CHG solution, pt received, instructions reviewed  These surgical instructions were reviewed with patient during the PAT phone interview.

## 2021-06-16 DIAGNOSIS — Z0000 Encounter for general adult medical examination without abnormal findings: Secondary | ICD-10-CM | POA: Diagnosis not present

## 2021-06-16 DIAGNOSIS — Z125 Encounter for screening for malignant neoplasm of prostate: Secondary | ICD-10-CM | POA: Diagnosis not present

## 2021-06-16 DIAGNOSIS — Z1339 Encounter for screening examination for other mental health and behavioral disorders: Secondary | ICD-10-CM | POA: Diagnosis not present

## 2021-06-16 DIAGNOSIS — M1611 Unilateral primary osteoarthritis, right hip: Secondary | ICD-10-CM | POA: Diagnosis not present

## 2021-06-16 DIAGNOSIS — E782 Mixed hyperlipidemia: Secondary | ICD-10-CM | POA: Diagnosis not present

## 2021-06-16 DIAGNOSIS — I10 Essential (primary) hypertension: Secondary | ICD-10-CM | POA: Diagnosis not present

## 2021-06-16 DIAGNOSIS — M1612 Unilateral primary osteoarthritis, left hip: Secondary | ICD-10-CM | POA: Diagnosis not present

## 2021-06-16 DIAGNOSIS — M1990 Unspecified osteoarthritis, unspecified site: Secondary | ICD-10-CM | POA: Diagnosis not present

## 2021-06-16 DIAGNOSIS — Z1331 Encounter for screening for depression: Secondary | ICD-10-CM | POA: Diagnosis not present

## 2021-06-17 ENCOUNTER — Encounter: Payer: Self-pay | Admitting: Student

## 2021-06-17 ENCOUNTER — Other Ambulatory Visit: Payer: Self-pay

## 2021-06-17 ENCOUNTER — Ambulatory Visit: Payer: Medicare Other | Admitting: Student

## 2021-06-17 VITALS — BP 140/72 | HR 73 | Temp 98.2°F | Ht 71.0 in | Wt 211.0 lb

## 2021-06-17 DIAGNOSIS — I10 Essential (primary) hypertension: Secondary | ICD-10-CM

## 2021-06-17 DIAGNOSIS — E7800 Pure hypercholesterolemia, unspecified: Secondary | ICD-10-CM

## 2021-06-17 DIAGNOSIS — Z01818 Encounter for other preprocedural examination: Secondary | ICD-10-CM

## 2021-06-17 NOTE — Progress Notes (Signed)
Primary Physician/Referring:  Fanny Bien, MD  Patient ID: Cole Barker, male    DOB: [birth year], 85 y.o.   MRN: [id number]  Chief Complaint  Patient presents with   Pre-op Exam   Coronary Artery Disease   HPI:    Cole Barker  is a 85 y.o. Caucasian male with history of hypertension and hyperlipidemia.  Denies history of diabetes, tobacco use, family history of premature CAD, MI, TIA/CVA.  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  He states he goes up and down 3-4 flights of stairs without issue several times per week.  Denies chest pain, palpitations, syncope, near syncope.  Denies orthopnea, PND, dyspnea.  Patient brings with him a written log of home blood pressure readings which average 122/65 mmHg.  He is presently scheduled for right total hip replacement 06/27/2021 with Dr. Berdie Ogren in [place] and presents to [place] for preoperative risk stratification.  Past Medical History:  Diagnosis Date   Hyperlipidemia    Hypertension    Neuromuscular disorder ([place])    neuropathy/corpal tunnel   Past Surgical History:  Procedure Laterality Date   CARPAL TUNNEL RELEASE Right 09/17/2017   Procedure: CARPAL TUNNEL RELEASE;  Surgeon: [name], MD;  Location: [facility] ORS;  Service: Neurosurgery;  Laterality: Right;   CARPAL TUNNEL RELEASE Left 10/29/2017   Procedure: CARPAL TUNNEL RELEASE;  Surgeon: [name], MD;  Location: [facility] ORS;  Service: Neurosurgery;  Laterality: Left;   JOINT REPLACEMENT  2009   left   Family History  Problem Relation Age of Onset   Heart attack Father    Stroke Father     Social History   Tobacco Use   Smoking status: Never   Smokeless tobacco: Never  Substance Use Topics   Alcohol use: Yes    Comment: occassional   Marital Status: Widowed   ROS  Review of Systems  Constitutional: Negative for malaise/fatigue and weight gain.  Cardiovascular:   Negative for chest pain, claudication, leg swelling, near-syncope, orthopnea, palpitations, paroxysmal nocturnal dyspnea and syncope.  Respiratory:  Negative for shortness of breath.   Neurological:  Negative for dizziness.   Objective  Blood pressure 140/72, pulse 73, temperature 98.2 F (36.8 C), temperature source Temporal, height 5\' 11"  (1.803 m), weight 211 lb (95.7 kg), SpO2 99 %.  Vitals with BMI 06/17/2021 06/17/2021 10/13/2020  Height - 5\' 11"  5\' 11"   Weight - 211 lbs 219 lbs  BMI - 62.83 15.17  Systolic 616 073 710  Diastolic 72 79 92  Pulse 73 76 95     Physical Exam Vitals reviewed.  HENT:     Head: Normocephalic and atraumatic.  Cardiovascular:     Rate and Rhythm: Normal rate and regular rhythm.     Pulses: Intact distal pulses.     Heart sounds: S1 normal and S2 normal. No murmur heard.   No gallop.  Pulmonary:     Effort: Pulmonary effort is normal. No respiratory distress.     Breath sounds: No wheezing, rhonchi or rales.  Musculoskeletal:     Right lower leg: No edema.     Left lower leg: No edema.  Neurological:     Mental Status: He is alert.    Laboratory examination:   No results for input(s): NA, K, CL, CO2, GLUCOSE, BUN, CREATININE, CALCIUM, GFRNONAA, GFRAA in the last 8760 hours. CrCl cannot be calculated (Patient's most recent lab result is older than the  maximum 21 days allowed.).  CMP Latest Ref Rng & Units 09/11/2017  Glucose 65 - 99 mg/dL 122(H)  BUN 6 - 20 mg/dL 25(H)  Creatinine 0.61 - 1.24 mg/dL 0.85  Sodium 135 - 145 mmol/L 138  Potassium 3.5 - 5.1 mmol/L 3.9  Chloride 101 - 111 mmol/L 106  CO2 22 - 32 mmol/L 25  Calcium 8.9 - 10.3 mg/dL 9.1   CBC Latest Ref Rng & Units 09/11/2017  WBC 3.8 - 10.6 K/uL 4.2  Hemoglobin 13.0 - 18.0 g/dL 15.2  Hematocrit 40.0 - 52.0 % 45.0  Platelets 150 - 440 K/uL 162    Lipid Panel No results for input(s): CHOL, TRIG, LDLCALC, VLDL, HDL, CHOLHDL, LDLDIRECT in the last 8760 hours.  HEMOGLOBIN A1C No  results found for: HGBA1C, MPG TSH No results for input(s): TSH in the last 8760 hours.  External labs:  None   Allergies   Allergies  Allergen Reactions   Adhesive [Tape]     Burns, tears up skin   Other Other (See Comments) and Rash    Blisters Burns, tears up skin    Medications Prior to Visit:   Outpatient Medications Prior to Visit  Medication Sig Dispense Refill   amLODipine (NORVASC) 10 MG tablet Take 10 mg by mouth daily.  3   aspirin EC 81 MG tablet Take 81 mg by mouth daily. Swallow whole.     atorvastatin (LIPITOR) 20 MG tablet Take 10 mg by mouth daily. Pt takes 1/2 tablet     cetirizine (ZYRTEC) 10 MG tablet Take 10 mg by mouth daily.     Cholecalciferol (D3 5000) 125 MCG (5000 UT) capsule Take 5,000 Units by mouth daily.     CHROMIUM GTF PO Take 1 tablet by mouth daily.     lisinopril (PRINIVIL,ZESTRIL) 5 MG tablet Take 5 mg by mouth daily.     OVER THE COUNTER MEDICATION Take 2 tablets by mouth daily. everflex     OVER THE COUNTER MEDICATION Take 1 tablet by mouth daily. Coryza forte otc supplement     OVER THE COUNTER MEDICATION Take 2 tablets by mouth daily. Fenshi otc supplement     folic acid (FOLVITE) 062 MCG tablet Take 800 mcg by mouth daily.     naproxen sodium (ALEVE) 220 MG tablet Take 220 mg by mouth daily as needed (pain).     nystatin-triamcinolone ointment (MYCOLOG) Apply 1 application topically 2 (two) times daily. 30 g 2   Potassium 99 MG TABS Take 99 mg by mouth daily.     triamcinolone cream (KENALOG) 0.1 % Apply 1 application topically 2 (two) times daily. For 14 days  0   Vitamins A & D 10000-400 units TABS Take 1 tablet by mouth daily.     No facility-administered medications prior to visit.   Final Medications at End of Visit    Current Meds  Medication Sig   amLODipine (NORVASC) 10 MG tablet Take 10 mg by mouth daily.   aspirin EC 81 MG tablet Take 81 mg by mouth daily. Swallow whole.   atorvastatin (LIPITOR) 20 MG tablet Take 10 mg  by mouth daily. Pt takes 1/2 tablet   cetirizine (ZYRTEC) 10 MG tablet Take 10 mg by mouth daily.   Cholecalciferol (D3 5000) 125 MCG (5000 UT) capsule Take 5,000 Units by mouth daily.   CHROMIUM GTF PO Take 1 tablet by mouth daily.   lisinopril (PRINIVIL,ZESTRIL) 5 MG tablet Take 5 mg by mouth daily.   OVER THE COUNTER  MEDICATION Take 2 tablets by mouth daily. everflex   OVER THE COUNTER MEDICATION Take 1 tablet by mouth daily. Coryza forte otc supplement   OVER THE COUNTER MEDICATION Take 2 tablets by mouth daily. Fenshi otc supplement   Radiology:   No results found.  Cardiac Studies:   None   EKG:   06/17/2021: Sinus rhythm at a rate of 69 bpm.  Normal axis.  Non-specific T-wave abnormality.  No evidence of ischemia or underlying injury pattern.  Assessment     ICD-10-CM   1. Pre-op evaluation  Z01.818 EKG 12-Lead    2. Essential hypertension  I10     3. Hypercholesteremia  E78.00        There are no discontinued medications.  No orders of the defined types were placed in this encounter.   Recommendations:   Cole Barker is a 85 y.o. Caucasian male with history of hypertension hyperlipidemia referred to our office for preoperative risk stratification prior to upcoming right total hip replacement.  Patient is quite active, particularly for his age with good functional status.  Patient does have risk factors including hypertension hyperlipidemia, however blood pressure is well controlled and he is on statin therapy.  Patient's EKG is without evidence of ischemia or underlying injury pattern and patient is asymptomatic.  Discussed with patient risks versus benefits of further cardiovascular testing.  Given patient's good functional status shared decision was that patient may proceed for surgery without further cardiovascular testing.  Patient is overall low risk from a cardiovascular standpoint for upcoming orthopedic surgery.  I personally discussed patient case with  Dr. Einar Gip.  He reviewed patient's medical records and is in agreement that patient may proceed with surgery with low risk from a cardiac standpoint.   Alethia Berthold, PA-C 06/17/2021, 2:19 PM Office: [phone]

## 2021-06-26 NOTE — H&P (Signed)
H&P by Hermenia Bers, PA at 06/26/21 1311                Author: Hermenia Bers, PA  Service: Orthopedic Surgery  Author Type: Physician Assistant       Filed: 06/26/21 1311  Date of Service: 06/26/21 1311  Status: Signed           Editor: Hermenia Bers, PA (Physician Assistant)  Cosigner: Derryl Harbor, MD at 06/27/21 [facility]                       [facility]   History and Physical Exam          Patient: Jesus Arellano  MRN: [id number]   SSN: [id number]          Date of Birth: 04/16/1936   Age: 85 y.o.   Sex: male           Subjective:         Chief Complaint: right hip pain      History of Present Illness:  Patient complains of right hip pain and difficulty ambulating, which has progressed over the past several months.  X-rays showed osteoarthritis of the joint.  The patient's  pain has persisted and progressed despite conservative treatments and therapies.   The patient has been previously treated with medications and/or injections.  The patient has at this time opted for surgical intervention.            Past Medical History:        Diagnosis  Date         ?  Arthritis            OA         ?  Chronic pain            right hip         ?  High cholesterol       ?  Hypertension           ?  Osteoarthritis of right hip  06/26/2021          Past Surgical History:         Procedure  Laterality  Date          ?  HX CARPAL TUNNEL RELEASE  Bilateral  11/27/2017     ?  HX COLONOSCOPY         ?  HX HIP REPLACEMENT  Left  2009     ?  HX OTHER SURGICAL              pre-melanoma removed on face & back & abdomen          Social History          Occupational History        ?  Not on file       Tobacco Use         ?  Smoking status:  Never     ?  Smokeless tobacco:  Never       Substance and Sexual Activity         ?  Alcohol use:  Yes              Alcohol/week:  3.0 standard drinks              Types:  3 Shots of liquor per week  Comment: scotch         ?  Drug  use:  Never         ?  Sexual activity:  Not on file          Prior to Admission medications             Medication  Sig  Start Date  End Date  Taking?  Authorizing Provider            amLODIPine (NORVASC) 10 mg tablet  TAKE 1 TABLET (10 MG) BY MOUTH DAILY  06/28/20      Provider, Historical     atorvastatin (LIPITOR) 20 mg tablet  Take 10 mg by mouth daily.        Provider, Historical     cetirizine (ZYRTEC) 10 mg tablet  Take 10 mg by mouth daily.        Provider, Historical     lisinopriL (PRINIVIL, ZESTRIL) 5 mg tablet  Take 1 Tablet by mouth daily.  05/16/21      Provider, Historical     aspirin delayed-release 81 mg tablet  Take 81 mg by mouth daily.        Provider, Historical     Vitamin A Palmitate-Vitamin D2 10,000-400 unit tab  Take 1 Tablet by mouth daily.        Provider, Historical     omega 3-dha-epa-fish oil (Fish OiL) 100-160-1,000 mg cap  Take  by mouth.        Provider, Historical     naproxen sodium (NAPROSYN) 220 mg tablet  Take 220 mg by mouth daily as needed.        Provider, Historical     organ concentrates (PANCREAS, RAW PO)  Take  by mouth.        Provider, Historical            cholecalciferol (Vitamin D3) 25 mcg (1,000 unit) cap  Take 5,000 Units by mouth daily.        Provider, Historical           Allergies: No Known Allergies       Review of Systems:   A comprehensive review of systems was negative except for that written in the History of Present Illness.        Objective:          Physical Exam:   HEENT: Normocephalic, atraumatic   Lungs:  Clear to auscultation   Heart:   Regular rate and rhythm   Abdomen: Soft   Extremities:  Pain with range of motion of the right hip.  Passive flexion 90-100 degrees,                        passive internal rotation 0-10 degrees, with pain throughout ROM,                         passive external rotation 10-20 degrees with pain at the arc of motion.                        Antalgic gait noted.        Assessment:         Arthritis of the right hip.         Plan:          Proceed with scheduled RIGHT TOTAL HIP ARTHROPLASTY.      The various methods of treatment  have been discussed with the patient and family. After consideration of risks, benefits, and other options for treatment, the patient has consented to surgical interventions. Questions were answered and preoperative teaching  was done by Dr Rosary Lively.          Signed By:  Hermenia Bers, PA           June 26, 2021

## 2021-06-27 ENCOUNTER — Ambulatory Visit: Admit: 2021-06-27 | Payer: MEDICARE | Primary: Student in an Organized Health Care Education/Training Program

## 2021-06-27 ENCOUNTER — Inpatient Hospital Stay: Payer: MEDICARE

## 2021-06-27 DIAGNOSIS — E7800 Pure hypercholesterolemia, unspecified: Secondary | ICD-10-CM | POA: Diagnosis not present

## 2021-06-27 DIAGNOSIS — M25551 Pain in right hip: Secondary | ICD-10-CM | POA: Diagnosis not present

## 2021-06-27 DIAGNOSIS — G8929 Other chronic pain: Secondary | ICD-10-CM | POA: Diagnosis not present

## 2021-06-27 DIAGNOSIS — M1611 Unilateral primary osteoarthritis, right hip: Secondary | ICD-10-CM | POA: Diagnosis not present

## 2021-06-27 DIAGNOSIS — Z7982 Long term (current) use of aspirin: Secondary | ICD-10-CM | POA: Diagnosis not present

## 2021-06-27 DIAGNOSIS — Z79899 Other long term (current) drug therapy: Secondary | ICD-10-CM | POA: Diagnosis not present

## 2021-06-27 DIAGNOSIS — I10 Essential (primary) hypertension: Secondary | ICD-10-CM | POA: Diagnosis not present

## 2021-06-27 DIAGNOSIS — Z96641 Presence of right artificial hip joint: Secondary | ICD-10-CM | POA: Diagnosis not present

## 2021-06-27 MED ORDER — TRANEXAMIC ACID 1,000 MG/10 ML (100 MG/ML) IV
1000 mg/10 mL (100 mg/mL) | INTRAVENOUS | Status: DC
Start: 2021-06-27 — End: 2021-06-27
  Administered 2021-06-27 (×2): via INTRAVENOUS

## 2021-06-27 MED ORDER — DEXAMETHASONE SODIUM PHOSPHATE 4 MG/ML IJ SOLN
4 mg/mL | INTRAMUSCULAR | Status: AC
Start: 2021-06-27 — End: ?

## 2021-06-27 MED ORDER — CEFADROXIL 500 MG CAP
500 mg | ORAL_CAPSULE | Freq: Two times a day (BID) | ORAL | 0 refills | Status: AC
Start: 2021-06-27 — End: 2021-07-02

## 2021-06-27 MED ORDER — PROPOFOL 10 MG/ML IV EMUL
10 mg/mL | INTRAVENOUS | Status: DC | PRN
Start: 2021-06-27 — End: 2021-06-27
  Administered 2021-06-27 (×2): via INTRAVENOUS

## 2021-06-27 MED ORDER — ASPIRIN 81 MG TAB, DELAYED RELEASE
81 mg | ORAL_TABLET | Freq: Two times a day (BID) | ORAL | 0 refills | Status: AC
Start: 2021-06-27 — End: 2021-07-18

## 2021-06-27 MED ORDER — HYDROMORPHONE 1 MG/ML INJECTION SOLUTION
1 mg/mL | INTRAMUSCULAR | Status: DC | PRN
Start: 2021-06-27 — End: 2021-06-27

## 2021-06-27 MED ORDER — CELECOXIB 100 MG CAP
100 mg | ORAL | Status: AC
Start: 2021-06-27 — End: 2021-06-27
  Administered 2021-06-27: 12:00:00 via ORAL

## 2021-06-27 MED ORDER — ACETAMINOPHEN 500 MG TAB
500 mg | Freq: Once | ORAL | Status: AC
Start: 2021-06-27 — End: 2021-06-27
  Administered 2021-06-27: 12:00:00 via ORAL

## 2021-06-27 MED ORDER — DEXAMETHASONE SODIUM PHOSPHATE 4 MG/ML IJ SOLN
4 mg/mL | Freq: Once | INTRAMUSCULAR | Status: AC
Start: 2021-06-27 — End: 2021-06-27
  Administered 2021-06-27: 15:00:00 via INTRAVENOUS

## 2021-06-27 MED ORDER — LACTATED RINGERS IV
INTRAVENOUS | Status: DC
Start: 2021-06-27 — End: 2021-06-27
  Administered 2021-06-27: 12:00:00 via INTRAVENOUS

## 2021-06-27 MED ORDER — DEXAMETHASONE SODIUM PHOSPHATE 4 MG/ML IJ SOLN
4 mg/mL | INTRAMUSCULAR | Status: DC | PRN
Start: 2021-06-27 — End: 2021-06-27
  Administered 2021-06-27: 14:00:00 via INTRAVENOUS

## 2021-06-27 MED ORDER — PROPOFOL 10 MG/ML IV EMUL
10 mg/mL | INTRAVENOUS | Status: AC
Start: 2021-06-27 — End: ?

## 2021-06-27 MED ORDER — FLUMAZENIL 0.1 MG/ML IV SOLN
0.1 mg/mL | INTRAVENOUS | Status: DC | PRN
Start: 2021-06-27 — End: 2021-06-27

## 2021-06-27 MED ORDER — ONDANSETRON (PF) 4 MG/2 ML INJECTION
4 mg/2 mL | Freq: Once | INTRAMUSCULAR | Status: DC
Start: 2021-06-27 — End: 2021-06-27

## 2021-06-27 MED ORDER — KETOROLAC TROMETHAMINE 30 MG/ML INJECTION
30 mg/mL (1 mL) | INTRAMUSCULAR | Status: AC
Start: 2021-06-27 — End: ?

## 2021-06-27 MED ORDER — OXYCODONE 5 MG TAB
5 mg | ORAL | Status: DC | PRN
Start: 2021-06-27 — End: 2021-06-27

## 2021-06-27 MED ORDER — LACTATED RINGERS BOLUS IV
Freq: Once | INTRAVENOUS | Status: AC
Start: 2021-06-27 — End: 2021-06-27
  Administered 2021-06-27: 12:00:00 via INTRAVENOUS

## 2021-06-27 MED ORDER — MEPIVACAINE (PF) 15 MG/ML (1.5 %) INJECTION
15 mg/mL (1.5 %) | INTRAMUSCULAR | Status: AC
Start: 2021-06-27 — End: 2021-06-27
  Administered 2021-06-27: 13:00:00

## 2021-06-27 MED ORDER — NALOXONE 0.4 MG/ML INJECTION
0.4 mg/mL | INTRAMUSCULAR | Status: DC | PRN
Start: 2021-06-27 — End: 2021-06-27

## 2021-06-27 MED ORDER — OXYCODONE 5 MG TAB
5 mg | ORAL_TABLET | ORAL | 0 refills | Status: AC | PRN
Start: 2021-06-27 — End: 2021-07-04

## 2021-06-27 MED ORDER — LACTATED RINGERS IV
INTRAVENOUS | Status: DC | PRN
Start: 2021-06-27 — End: 2021-06-27
  Administered 2021-06-27 (×2): via INTRAVENOUS

## 2021-06-27 MED ORDER — TRANEXAMIC ACID 1,000 MG/100 ML(10 MG/ML) IN (ISO-OSMOTIC) SODIUM CHLORIDE IVPB
1000 mg/100 mL (10 mg/mL) | INTRAVENOUS | Status: DC
Start: 2021-06-27 — End: 2021-06-27

## 2021-06-27 MED ORDER — LACTATED RINGERS IV
INTRAVENOUS | Status: DC
Start: 2021-06-27 — End: 2021-06-27

## 2021-06-27 MED ORDER — OXYCODONE-ACETAMINOPHEN 5 MG-325 MG TAB
5-325 mg | Freq: Once | ORAL | Status: DC | PRN
Start: 2021-06-27 — End: 2021-06-27

## 2021-06-27 MED ORDER — MELOXICAM 7.5 MG TAB
7.5 mg | ORAL_TABLET | Freq: Two times a day (BID) | ORAL | 0 refills | Status: AC
Start: 2021-06-27 — End: 2021-07-11

## 2021-06-27 MED ORDER — LIDOCAINE (PF) 10 MG/ML (1 %) IJ SOLN
10 mg/mL (1 %) | INTRAMUSCULAR | Status: AC
Start: 2021-06-27 — End: 2021-06-27
  Administered 2021-06-27: 13:00:00 via INTRADERMAL

## 2021-06-27 MED ORDER — FENTANYL CITRATE (PF) 50 MCG/ML IJ SOLN
50 mcg/mL | INTRAMUSCULAR | Status: DC | PRN
Start: 2021-06-27 — End: 2021-06-27

## 2021-06-27 MED ORDER — ROPIVACAINE (PF) 5 MG/ML (0.5 %) INJECTION
5 mg/mL (0. %) | INTRAMUSCULAR | Status: DC | PRN
Start: 2021-06-27 — End: 2021-06-27
  Administered 2021-06-27: 14:00:00 via INTRAMUSCULAR

## 2021-06-27 MED ORDER — CEFAZOLIN 2 GRAM/20 ML IN STERILE WATER INTRAVENOUS SYRINGE
2 gram/0 mL | Freq: Once | INTRAVENOUS | Status: AC
Start: 2021-06-27 — End: 2021-06-27
  Administered 2021-06-27: 14:00:00 via INTRAVENOUS

## 2021-06-27 MED ORDER — DEXAMETHASONE SODIUM PHOSPHATE 4 MG/ML IJ SOLN
4 mg/mL | Freq: Once | INTRAMUSCULAR | Status: AC
Start: 2021-06-27 — End: 2021-06-27
  Administered 2021-06-27: 12:00:00 via INTRAVENOUS

## 2021-06-27 MED ORDER — PANTOPRAZOLE 40 MG TAB, DELAYED RELEASE
40 mg | Freq: Every day | ORAL | Status: DC
Start: 2021-06-27 — End: 2021-06-27
  Administered 2021-06-27: 12:00:00 via ORAL

## 2021-06-27 MED ORDER — LIDOCAINE (PF) 20 MG/ML (2 %) IJ SOLN
20 mg/mL (2 %) | INTRAMUSCULAR | Status: AC
Start: 2021-06-27 — End: ?

## 2021-06-27 MED ORDER — LACTATED RINGERS IV
10 % | INTRAVENOUS | Status: DC | PRN
Start: 2021-06-27 — End: 2021-06-27
  Administered 2021-06-27: 14:00:00

## 2021-06-27 MED ORDER — LIDOCAINE (PF) 20 MG/ML (2 %) IJ SOLN
20 mg/mL (2 %) | INTRAMUSCULAR | Status: DC | PRN
Start: 2021-06-27 — End: 2021-06-27
  Administered 2021-06-27: 14:00:00 via INTRAVENOUS

## 2021-06-27 MED ORDER — PROPOFOL 10 MG/ML IV EMUL
10 mg/mL | INTRAVENOUS | Status: DC | PRN
Start: 2021-06-27 — End: 2021-06-27
  Administered 2021-06-27 (×3): via INTRAVENOUS

## 2021-06-27 MED ORDER — TRANEXAMIC ACID 1,000 MG/100 ML(10 MG/ML) IN (ISO-OSMOTIC) SODIUM CHLORIDE IVPB
1000 mg/100 mL (10 mg/mL) | INTRAVENOUS | Status: AC
Start: 2021-06-27 — End: ?

## 2021-06-27 MED ORDER — ROPIVACAINE (PF) 5 MG/ML (0.5 %) INJECTION
5 mg/mL (0. %) | INTRAMUSCULAR | Status: AC
Start: 2021-06-27 — End: ?

## 2021-06-27 MED ORDER — OXYCODONE 5 MG TAB
5 mg | ORAL_TABLET | ORAL | 0 refills | Status: DC | PRN
Start: 2021-06-27 — End: 2021-06-27

## 2021-06-27 MED FILL — PANTOPRAZOLE 40 MG TAB, DELAYED RELEASE: 40 mg | ORAL | Qty: 1

## 2021-06-27 MED FILL — DEXAMETHASONE SODIUM PHOSPHATE 4 MG/ML IJ SOLN: 4 mg/mL | INTRAMUSCULAR | Qty: 2

## 2021-06-27 MED FILL — DIPRIVAN 10 MG/ML INTRAVENOUS EMULSION: 10 mg/mL | INTRAVENOUS | Qty: 40

## 2021-06-27 MED FILL — CEFAZOLIN 2 GRAM/20 ML IN STERILE WATER INTRAVENOUS SYRINGE: 2 gram/0 mL | INTRAVENOUS | Qty: 20

## 2021-06-27 MED FILL — DEXAMETHASONE SODIUM PHOSPHATE 4 MG/ML IJ SOLN: 4 mg/mL | INTRAMUSCULAR | Qty: 1

## 2021-06-27 MED FILL — CELECOXIB 100 MG CAP: 100 mg | ORAL | Qty: 4

## 2021-06-27 MED FILL — ACETAMINOPHEN 500 MG TAB: 500 mg | ORAL | Qty: 2

## 2021-06-27 MED FILL — LIDOCAINE (PF) 20 MG/ML (2 %) IJ SOLN: 20 mg/mL (2 %) | INTRAMUSCULAR | Qty: 5

## 2021-06-27 MED FILL — KETOROLAC TROMETHAMINE 30 MG/ML INJECTION: 30 mg/mL (1 mL) | INTRAMUSCULAR | Qty: 1

## 2021-06-27 MED FILL — LACTATED RINGERS IV: INTRAVENOUS | Qty: 1000

## 2021-06-27 MED FILL — TRANEXAMIC ACID 1,000 MG/100 ML(10 MG/ML) IN (ISO-OSMOTIC) SODIUM CHLORIDE IVPB: 1000 mg/100 mL (10 mg/mL) | INTRAVENOUS | Qty: 100

## 2021-06-27 MED FILL — DIPRIVAN 10 MG/ML INTRAVENOUS EMULSION: 10 mg/mL | INTRAVENOUS | Qty: 20

## 2021-06-27 MED FILL — ROPIVACAINE (PF) 5 MG/ML (0.5 %) INJECTION: 5 mg/mL (0. %) | INTRAMUSCULAR | Qty: 60

## 2021-06-27 NOTE — Interval H&P Note (Signed)
Discharge instructions reviewed with patient and caregiver, both verbalize understanding

## 2021-06-27 NOTE — Op Note (Signed)
Op Notes  by Derryl Harbor, MD at 06/27/21 [phone]                Author: Derryl Harbor, MD  Service: Orthopedic Surgery  Author Type: Physician       Filed: 06/27/21 0946  Date of Service: 06/27/21 0851  Status: Signed          Editor: Derryl Harbor, MD (Physician)                       [facility]   Total Hip Arthroplasty             Patient: Jesus Arellano  MRN: [id number]   SSN: [id number]          Date of Birth: [birth year]   Age: 85 y.o.   Sex: male         Date of Surgery: 06/27/2021    Preoperative Diagnosis: RIGHT HIP OSTEOARTHRITIS    Postoperative Diagnosis: RIGHT HIP OSTEOARTHRITIS    Location: [facility]   Surgeon: Derryl Harbor, MD   Assistant: Reginal Lutes PA-C       Anesthesia: spinal      Procedure: Total Right  Hip Arthroplasty      Findings: Degenerative joint disease of the right hip.       Estimated Blood Loss:      Specimens: None      Complications: none      Implants:              Implant Name  Type  Inv. Item  Serial No.  Manufacturer  Lot No.  LRB  No. Used  Action               LINER ACET OD58MM ID36MM NEUT LEGEND - [id number]    LINER ACET OD58MM ID36MM NEUT LEGEND    [employer]  [id number]  Right  1  Implanted     SHELL ACET OD58MM 3 H HIGHLY POR HMSPHR LEGEND - [id number]    SHELL ACET OD58MM 3 H HIGHLY POR HMSPHR LEGEND    [employer]  [id number]  Right  1  Implanted     STEM FEM SZ 16 STD HIP CLLR MOD ENTRADA - [id number]    STEM FEM SZ 16 STD HIP CLLR MOD ENTRADA    [employer]  [id number]  Right  1  Implanted               HEAD FEM DIA36MM +0MM 12 14 TAPR CERAMIC BIOLOX DELT - [id number]    HEAD FEM DIA36MM +0MM 12 14 TAPR CERAMIC BIOLOX DELT    [employer]  [id number]  Right  1  Implanted           Procedure Detail:   After the patient was brought to the operating suite, He was effectively anesthetized using general anesthesia, then transferred to the Hana  table  and secured in a standard fashion. His right hip was then prepped and draped in a normal sterile orthopedic fashion. He was given appropriate intravenous antibiotics preoperatively. After a proper timeout was performed, a direct anterior approach  to the hip was performed using a short Smith-Petersen interval. Anterior capsulotomy was performed. The degenerative changes of the hip were noted. Femoral neck osteotomy was then performed to the templated area. The head and neck were removed. The pulvinar  and labrum were excised. The acetabulum was then  reamed up to 58 mm with good bleeding cancellous bone obtained. The cup was then irrigated with pulse lavage system. A 58 mm orthodevelopment Legend cup was then impacted in place with excellent stable  fixation obtained, placing the cup at about 45 degrees of abduction, 20 degrees of anteversion. The liner was then impacted in place. A screw was not placed.      Attention was turned to the femur, which was delivered into the wound with a combination of extension, external rotation, and adduction, and using the hook on the Hana table to deliver the femur into the wound. The canal was broached up to a size 16 for  the Entrada stem system with excellent stable fixation obtained. A trial reduction was then performed with the standard neck offset and 36 mm head balls with various neck lengths. With the +0, he appeared to have equalization of leg lengths and restoration  of offset radiographically, and excellent functional stability was noted. The trial broach was removed. The canal was irrigated with the pulse lavage system. The final components were impacted in place with excellent stable fixation obtained once again.  The final reduction was performed and once again leg lengths and offset were restored radiographically, using the C-arm radiographically intraoperatively, and excellent functional stability was noted. The wound was then irrigated one more time, and then   closed in layers. The fascia of the tensor was closed with #1 Vicryl in a running type stitch. Subcutaneous tissue was closed with 2-0 Vicryl in a simple buried stitch, and the skin was closed with Prineo. Dry, sterile dressing was then applied. He tolerated  this well, was transferred to the bed, and taken to recovery room, extubated, in stable condition. All sponge and needle counts were correct.         Signed By:  Derryl Harbor, MD           June 27, 2021               Operative Note      Patient: Jesus Arellano   Date of Birth: [birth year]   MRN: [id number]      Date of Procedure: 06/27/2021       Pre-Op Diagnosis: RIGHT HIP OSTEOARTHRITIS      Post-Op Diagnosis: Same as preoperative diagnosis.        Procedure(s):   RIGHT TOTAL HIP  ANTERIOR APPROACH WITH C-ARM      Surgeon(s):   Derryl Harbor, MD      Surgical Assistant: Physician Assistant: Hermenia Bers, PA      Anesthesia: Spinal       Estimated Blood Loss (mL):  200       Complications: None      Specimens: * No specimens in log *       Implants:              Implant Name  Type  Inv. Item  Serial No.  Manufacturer  Lot No.  LRB  No. Used  Action               LINER ACET OD58MM ID36MM NEUT LEGEND - [id number]    LINER ACET OD58MM ID36MM NEUT LEGEND    [employer]  [id number]  Right  1  Implanted     SHELL ACET OD58MM 3 H HIGHLY POR HMSPHR LEGEND - [id number]    SHELL ACET OD58MM 3 H HIGHLY POR HMSPHR LEGEND    [employer]  [id number]  Right  1  Implanted     STEM FEM SZ 16 STD HIP CLLR MOD ENTRADA - [id number]    STEM FEM SZ 16 STD HIP CLLR MOD ENTRADA    [employer]  [id number]  Right  1  Implanted               HEAD FEM DIA36MM +0MM 12 14 TAPR CERAMIC BIOLOX DELT - [id number]    HEAD FEM DIA36MM +0MM 12 14 TAPR CERAMIC BIOLOX DELT    [employer]  [id number]  Right  1  Implanted           Drains: * No LDAs found *      Findings: djd hip      Detailed Description of Procedure:    See above note       Electronically Signed by Derryl Harbor, MD on 06/27/2021 at 9:45 AM

## 2021-06-27 NOTE — Interval H&P Note (Signed)
PT/OT bedside to assess patient

## 2021-06-27 NOTE — Interval H&P Note (Signed)
Family updated

## 2021-06-27 NOTE — Progress Notes (Signed)
Problem: Mobility Impaired (Adult and Pediatric)  Goal: *Acute Goals and Plan of Care (Insert Text)  Description: PT goals to be met in 1 day:  Pt will be able to perform supine<>sit SBA for transfers at home.  Pt will be able to perform sit<>stand SBA for increased ability to transfer at home safely.  Pt will be able to participate in gt training >100' w/ RW, WBAT, GB and CGA/SBA for improved ability in home upon d/c.  Pt will be educated regarding HEP per MD protocol for optimal AROM/strength outcomes.     Note: [x]   Patient has met MD mobilization critieria for d/c home   [x]   Recommend HH with 24 hour adult care   []   Benefit from additional acute PT session to address:      PHYSICAL THERAPY EVALUATION    Patient: Jesus Arellano (85 y.o. male)  Date: 06/27/2021  Primary Diagnosis: Osteoarthritis of right hip [M16.11]  Procedure(s) (LRB):  RIGHT TOTAL HIP  ANTERIOR APPROACH WITH C-ARM (Right) Day of Surgery   Precautions:   Fall, WBAT  PLOF: Independent    ASSESSMENT :  Based on the objective data described below, decreased mobility in regards to bed mobility, transfers, gt quality and tolerance, balance and safety due to R THA surgery.  Decreased AROM of R hip, dec strength of R hip, pain in R hip, dec sensation of R hip also impacting pt functional mobility.  Pt rating pain on numerical pain scale pre/post and during session 0/10.  Pt ed regarding mobility safety, WB, HEP, ice application/use, elevation, environmental safety and home safe techniques.  Pt sitting in recliner upon arrival.  Pt able to perform sit<>stand w/ CGA/SBA.  Safety vc required throughout session to reinforce safety.  Pt able to participate in gt training using RW, GB, WBAT, and CGA w/ antalgic gt pattern.  Answered questions by pt and caregiver in regards to PT and mobility.  Pt left sitting in recliner w/ all needs within reach and ice pack to R hip.  Nurse Shaun aware of session and outcomes.  Recommend HHPT with responsible adult  care at least 24 hours upon hospital d/c.     Patient will benefit from skilled intervention to address the above impairments.  Patient's rehabilitation potential is considered to be Good  Factors which may influence rehabilitation potential include:   []          None noted  []          Mental ability/status  []          Medical condition  []          Home/family situation and support systems  []          Safety awareness  [x]          Pain tolerance/management  []          Other:      PLAN :  Recommendations and Planned Interventions:   [x]            Bed Mobility Training             []     Neuromuscular Re-Education  [x]            Transfer Training                   []     Orthotic/Prosthetic Training  [x]            Gait Training                          [  x]    Modalities  [x]            Therapeutic Exercises           [x]     Edema Management/Control  [x]            Therapeutic Activities            [x]     Family Training/Education  [x]            Patient Education  []            Other (comment):    Frequency/Duration: Patient will be followed by physical therapy 1-2 times per day/4-7 days per week to address goals.  Discharge Recommendations: Home Health  Further Equipment Recommendations for Discharge: N/A    AMPAC: 15/20    This AMPAC score should be considered in conjunction with interdisciplinary team recommendations to determine the most appropriate discharge setting. Patient's social support, diagnosis, medical stability, and prior level of function should also be taken into consideration.     SUBJECTIVE:   Patient stated "I feel no pain and I have been practicing my techniques."    OBJECTIVE DATA SUMMARY:     Past Medical History:   Diagnosis Date    Arthritis     OA    Chronic pain     right hip    High cholesterol     Hypertension     Osteoarthritis of right hip 06/26/2021     Past Surgical History:   Procedure Laterality Date    HX CARPAL TUNNEL RELEASE Bilateral 11/27/2017    HX COLONOSCOPY      HX HIP REPLACEMENT  Left 2009    HX OTHER SURGICAL      pre-melanoma removed on face & back & abdomen     Barriers to Learning/Limitations: yes;  physical and other anesthesia  Compensate with: Visual Cues, Verbal Cues, Tactile Cues, and Kinesthetic Cues  Home Situation:  Home Situation  Home Environment: Private residence  # Steps to Enter: 0  One/Two Story Residence: One story  Living Alone: Yes  Support Systems: Friend/Neighbor  Patient Expects to be Discharged to:: Home with home health  Current DME Used/Available at Home: Cane, straight, Raised toilet seat, Shower chair, Tub transfer bench, Walker, rolling  Tub or Shower Type: Tub/Shower combination  Critical Behavior:  Neurologic State: Alert  Orientation Level: Oriented to person;Oriented to place;Oriented to situation  Cognition: Follows commands  Safety/Judgement: Awareness of environment  Psychosocial  Patient Behaviors: Calm;Cooperative  Family  Behaviors: Supportive;Calm  Skin Condition/Temp: Warm;Dry  Family  Behaviors: Supportive;Calm  Skin Integrity: Incision (comment) (r hip)  Skin Integumentary  Skin Color: Appropriate for ethnicity  Skin Condition/Temp: Warm;Dry  Skin Integrity: Incision (comment) (r hip)  Strength:    Strength: Generally decreased, functional  Tone & Sensation:   Tone: Normal  Sensation: Impaired (R hip)  Range Of Motion:  AROM: Generally decreased, functional  PROM: Generally decreased, functional  Posture:  Functional Mobility:  Bed Mobility:  Scooting: Stand-by assistance  Transfers:  Sit to Stand: Contact guard assistance (vc)  Stand to Sit: Contact guard assistance;Stand-by assistance (vc)  Balance:   Sitting: Intact  Standing: Intact;With support  Ambulation/Gait Training:  Distance (ft): 140 Feet (ft)  Assistive Device: Walker, rolling;Gait belt  Ambulation - Level of Assistance: Contact guard assistance (vc)  Gait Abnormalities: Antalgic;Decreased step clearance;Step to gait  Right Side Weight Bearing: As tolerated  Base of Support: Shift to  left  Stance: Right decreased  Speed/Cadence: Slow  Step Length: Left shortened;Right shortened  Swing Pattern: Left asymmetrical;Right asymmetrical  Interventions: Safety awareness training;Tactile cues;Verbal cues;Visual/Demos  Stairs:  Therapeutic Exercises:   Encouraged HEP  Pain:  Pain level pre-treatment: 0/10   Pain level post-treatment: 0/10   Pain Intervention(s) : Medication (see MAR); Rest, Ice, Repositioning  Response to intervention: Nurse notified, See doc flow    Activity Tolerance:   Fair  Please refer to the flowsheet for vital signs taken during this treatment.  After treatment:   [x]          Patient left in no apparent distress sitting up in chair  []          Patient left in no apparent distress in bed  [x]          Call bell left within reach  [x]          Nursing notified  [x]          Caregiver present  []          Bed alarm activated  []          SCDs applied    COMMUNICATION/EDUCATION:   [x]          Role of Physical Therapy in the acute care setting.  [x]          Fall prevention education was provided and the patient/caregiver indicated understanding.  [x]          Patient/family have participated as able in goal setting and plan of care.  [x]          Patient/family agree to work toward stated goals and plan of care.  []          Patient understands intent and goals of therapy, but is neutral about his/her participation.  []          Patient is unable to participate in goal setting/plan of care: ongoing with therapy staff.  []          Other:    Thank you for this referral.  Darnelle Going, PT   Time Calculation: 23 mins      Eval Complexity: History: HIGH Complexity :3+ comorbidities / personal factors will impact the outcome/ POC Exam:MEDIUM Complexity : 3 Standardized tests and measures addressing body structure, function, activity limitation and / or participation in recreation  Presentation: LOW Complexity : Stable, uncomplicated  Clinical Decision Making:Low Complexity  Overall Complexity:LOW      [employer] AM-PAC Basic Mobility Inpatient Short Form (6-Clicks) Version 2    How much HELP from another person does the patient currently need.    (If the patient hasn't done an activity recently, how much help from another person do you think he/she would need if he/she tried?)   Total (Total A or Dep)   A Lot  (Mod to Max A)   A Little (Sup or Min A)   None (Mod I to I)   Turning from your back to your side while in a flat bed without using bedrails?   []  1 []  2 [x]  3 []  4   2. Moving from lying on your back to sitting on the side of a flat bed without using bedrails?    []  1 []  2 [x]  3 []  4   3. Moving to and from a bed to a chair (including a wheelchair)?   []  1 []  2 [x]  3 []  4   4. Standing up from a chair using your arms (e.g., wheelchair, or bedside chair)?   []  1 []  2 [x]  3 []   4   5. Walking in hospital room?   []  1 []  2 [x]  3 []  4   6. Climbing 3-5 steps with a railing?+   []  1 []  2 []  3 []  4   +If stair climbing cannot be assessed, skip item #6.  Sum responses from items 1-5.     Based on an AM-PAC score of 15/20 and their current functional mobility deficits, it is recommended that the patient have 3-5 sessions per week of Physical Therapy at d/c to increase the patient's independence.

## 2021-06-27 NOTE — Interval H&P Note (Signed)
Paged Dr Baird Lyons for sign out, patient met criteria for transferring to phase II.

## 2021-06-27 NOTE — Interval H&P Note (Signed)
Patient up to bathroom with use of walker, patient able to void.

## 2021-06-27 NOTE — Interval H&P Note (Signed)
Patient arrived via stretcher, patient has good sensation in lower extremities. Patient has call bell and family called to sit with patient. Patient is tolerating PO fluids and crackers

## 2021-06-27 NOTE — Discharge Summary (Signed)
Discharge Summary by Hermenia Bers, PA at 06/27/21 2119                Author: Hermenia Bers, PA  Service: Orthopedic Surgery  Author Type: Physician Assistant       Filed: 06/27/21 1344  Date of Service: 06/27/21 0953  Status: Signed           Editor: Hermenia Bers, PA (Physician Assistant)  Cosigner: Derryl Harbor, MD at 06/27/21 1432                      [facility]    [address] [facility] [place] [address]       DISCHARGE SUMMARY           PATIENT:  Jesus Arellano             MRN:  [id number]     ADMIT DATE:  06/27/2021          BILLING:  [id number]     DISCHARGE DATE:  06/27/2021           ATTENDING:  Rosary Lively, MD        DICTATING:  Hermenia Bers, PA        ADMISSION DIAGNOSIS: Osteoarthritis of right hip [M16.11]      DISCHARGE DIAGNOSIS: Status post RIGHT TOTAL HIP ARTHROPLASTY      HISTORY OF PRESENT ILLNESS: The patient is a 85 y.o. year-old male    with ongoing right hip pain secondary to osteoarthritis of right hip. The patient's pain has persisted and progressed despite conservative treatments and therapies. The patient has at this time opted for surgical intervention.      PAST MEDICAL HISTORY:      Past Medical History:        Diagnosis  Date         ?  Arthritis            OA         ?  Chronic pain            right hip         ?  High cholesterol       ?  Hypertension           ?  Osteoarthritis of right hip  06/26/2021           PAST SURGICAL HISTORY:      Past Surgical History:         Procedure  Laterality  Date          ?  HX CARPAL TUNNEL RELEASE  Bilateral  11/27/2017     ?  HX COLONOSCOPY         ?  HX HIP REPLACEMENT  Left  2009     ?  HX OTHER SURGICAL              pre-melanoma removed on face & back & abdomen           ALLERGIES: No Known Allergies       CURRENT MEDICATIONS:  A list of medications prior to the time of admission include:     Prior to Admission medications             Medication  Sig  Start Date  End Date   Taking?  Authorizing Provider            aspirin delayed-release 81 mg tablet  Take 1 Tablet by mouth two (2) times a day for 21 days.  06/27/21  07/18/21  Yes  Giuseppe Duchemin, Eileen Stanford A, PA     meloxicam (Mobic) 7.5 mg tablet  Take 1 Tablet by mouth two (2) times a day for 14 days.  06/27/21  07/11/21  Yes  Arie Gable, Eileen Stanford A, PA     cefadroxil (DURICEF) 500 mg capsule  Take 1 Capsule by mouth two (2) times a day for 5 days.  06/27/21  07/02/21  Yes  Eva Griffo, Eileen Stanford A, PA     oxyCODONE IR (ROXICODONE) 5 mg immediate release tablet  Take 1 Tablet by mouth every four (4) hours as needed for Pain for up to 7 days. Max Daily Amount: 30 mg.  06/27/21  07/04/21  Yes  Alvaretta Eisenberger, Eileen Stanford A, PA     amLODIPine (NORVASC) 10 mg tablet  TAKE 1 TABLET (10 MG) BY MOUTH DAILY  06/28/20    Yes  Provider, Historical     atorvastatin (LIPITOR) 20 mg tablet  Take 10 mg by mouth daily.      Yes  Provider, Historical     cetirizine (ZYRTEC) 10 mg tablet  Take 10 mg by mouth daily.      Yes  Provider, Historical     lisinopriL (PRINIVIL, ZESTRIL) 5 mg tablet  Take 1 Tablet by mouth daily.  05/16/21    Yes  Provider, Historical            cholecalciferol (VITAMIN D3) 25 mcg (1,000 unit) cap  Take 5,000 Units by mouth daily.        Provider, Historical           FAMILY HISTORY: History reviewed. No pertinent family history.      SOCIAL HISTORY:      Social History          Socioeconomic History         ?  Marital status:  WIDOWED       Tobacco Use         ?  Smoking status:  Never     ?  Smokeless tobacco:  Never       Substance and Sexual Activity         ?  Alcohol use:  Yes              Alcohol/week:  3.0 standard drinks         Types:  3 Shots of liquor per week             Comment: scotch         ?  Drug use:  Never           REVIEW OF SYSTEMS: All review of systems are negative.      PHYSICAL EXAMINATION: For a detailed physical exam, please refer to the patient's chart.      HOSPITAL COURSE: The patient was taken to surgery the day of admission.  he underwent right total hip replacement via the anterior approach.  Operative course was benign. Estimated blood loss approximately 300 cc. The patient was taken to the PACU in stable condition and was later discharged home in stable condition.      Post-op Day #0, patient has done very well.  he has had little to no pain.  he had been cleared by physical therapy with stair training.  he was placed on Aspirin for DVT prophylaxis.  his vitals have remained stable.  he has also remained hemodynamically  stable. The patient has been recommended for discharge home.       DISCHARGE INSTRUCTIONS: The patient is to be discharged home.         Discharge Medication List as of 06/27/2021 10:26 AM                 START taking these medications          Details        meloxicam (Mobic) 7.5 mg tablet  Take 1 Tablet by mouth two (2) times a day for 14 days., Normal, Disp-28 Tablet, R-0               cefadroxil (DURICEF) 500 mg capsule  Take 1 Capsule by mouth two (2) times a day for 5 days., Normal, Disp-10 Capsule, R-0                        CONTINUE these medications which have CHANGED          Details        aspirin delayed-release 81 mg tablet  Take 1 Tablet by mouth two (2) times a day for 21 days., Normal, Disp-42 Tablet, R-0               oxyCODONE IR (ROXICODONE) 5 mg immediate release tablet  Take 1 Tablet by mouth every four (4) hours as needed for Pain for up to 7 days. Max Daily Amount: 30 mg., Normal, Disp-42 Tablet, R-0Supervising physician  Dr Rosary Lively, MD                        CONTINUE these medications which have NOT CHANGED          Details        amLODIPine (NORVASC) 10 mg tablet  TAKE 1 TABLET (10 MG) BY MOUTH DAILY, Historical Med               atorvastatin (LIPITOR) 20 mg tablet  Take 10 mg by mouth daily., Historical Med               cetirizine (ZYRTEC) 10 mg tablet  Take 10 mg by mouth daily., Historical Med               lisinopriL (PRINIVIL, ZESTRIL) 5 mg tablet  Take 1 Tablet by mouth daily.,  Historical Med               cholecalciferol (VITAMIN D3) 25 mcg (1,000 unit) cap  Take 5,000 Units by mouth daily., Historical Med                        STOP taking these medications                  Vitamin A Palmitate-Vitamin D2 10,000-400 unit tab  Comments:    Reason for Stopping:                      omega 3-dha-epa-fish oil (Fish OiL) 100-160-1,000 mg cap  Comments:    Reason for Stopping:                      naproxen sodium (NAPROSYN) 220 mg tablet  Comments:    Reason for Stopping:                      organ concentrates (PANCREAS, RAW PO)  Comments:    Reason for Stopping:                                The patient is to continue at home with home physical therapy 3 times a week to work on gait training, range of motion, strengthening, and weightbearing exercises as tolerated on his right lower extremity. The patient is to progress from a walker to a  cane to complete total weightbearing as tolerable. The patient is to continue to keep his incision dry.  The patient is to followup with Dr. Rosary Lively, Reginal Lutes PA-C, and/or [facility] PA-C in the office approximately 10-14 days status post  for x-rays and further evaluation.         Hermenia Bers, PA   06/27/2021

## 2021-06-27 NOTE — Interval H&P Note (Signed)
 TRANSFER - IN REPORT:    Verbal report received from [facility] CRNA, Tour manager (name) on KIREE DEJARNETTE  being received from OR (unit) for routine progression of care      Report consisted of patient's Situation, Background, Assessment and   Recommendations(SBAR).     Information from the following report(s) OR Summary, Procedure Summary, Intake/Output, and MAR was reviewed with the receiving nurse.    Opportunity for questions and clarification was provided.      Assessment completed upon patient's arrival to unit and care assumed.

## 2021-06-27 NOTE — Interval H&P Note (Signed)
Pt up to bathroom to void with assistance.

## 2021-06-27 NOTE — Anesthesia Procedure Notes (Signed)
Spinal Block    Start time: 06/27/2021 8:18 AM  End time: 06/27/2021 8:29 AM  Performed by: Sharyon Cable, CRNA  Authorized by: Vira Browns, DO     Pre-procedure:  Indications: primary anesthetic  Preanesthetic Checklist: patient identified, risks and benefits discussed, anesthesia consent, site marked, patient being monitored, timeout performed and fire risk safety assessment completed and verbalized    Timeout Time: 08:15 EST      Spinal Block:   Patient Position:  Seated  Prep Region:  Lumbar  Prep: Betadine      Location:  L3-4  Technique:  Single shot  Local: lidocaine (PF) (XYLOCAINE) 10 mg/mL (1 %) IntraDERMAL - IntraDERMal   10 mg - 06/27/2021 8:18:00 AM  mepivacaine-pf (CARBOCAINE-PF) 1.5% PF injection - Other   45 mg - 06/27/2021 8:29:00 AM  Local Dose (mL):  3  Med Admin Time: 06/27/2021 8:29 AM    Needle:   Needle Type:  Pencan  Needle Gauge:  25 G  Attempts:  2      Events: CSF confirmed, no blood with aspiration and no paresthesia        Assessment:  Insertion:  Uncomplicated  Patient tolerance:  Patient tolerated the procedure well with no immediate complications

## 2021-06-27 NOTE — Interval H&P Note (Signed)
 TRANSFER - OUT REPORT:    Verbal report given to Shaun RN (name) on Jesus Arellano  being transferred to Phase II (unit) for routine progression of care       Report consisted of patient's Situation, Background, Assessment and   Recommendations(SBAR).     Information from the following report(s) SBAR, Procedure Summary, Intake/Output, and MAR was reviewed with the receiving nurse.    Lines:   Peripheral IV 06/27/21 Posterior;Right Hand (Active)   Site Assessment Clean, dry, & intact 06/27/21 0949   Phlebitis Assessment 0 06/27/21 0949   Infiltration Assessment 0 06/27/21 0949   Dressing Status Clean, dry, & intact 06/27/21 0949   Dressing Type Transparent;Tape 06/27/21 0949   Hub Color/Line Status Infusing 06/27/21 0949        Opportunity for questions and clarification was provided.      Patient transported with:   [employer]

## 2021-06-27 NOTE — Anesthesia Post-Procedure Evaluation (Signed)
Post-Anesthesia Evaluation & Assessment    Visit Vitals  BP 133/62   Pulse 61   Temp 36.3 ??C (97.4 ??F)   Resp 17   Ht 5' 11.75" (1.822 m)   Wt 95.4 kg (210 lb 4.8 oz)   SpO2 93%   BMI 28.72 kg/m??       Nausea/Vomiting: Controlled.    Post-operative hydration adequate.    Pain Scale 1: Numeric (0 - 10) (06/27/21 1027)  Pain Intensity 1: 0 (06/27/21 1027)   Managed    Pain score at or below stated goal level.    Mental status & Level of consciousness: alert and oriented x 3    Neurological status: moves all extremities, sensation grossly intact    Pulmonary status: airway patent, adequate oxygenation.    Complications related to anesthesia: none    Patient has met all PACU discharge requirements.      Tillie Rung Evon Lopezperez, DO

## 2021-06-27 NOTE — Progress Notes (Signed)
 Problem: Self Care Deficits Care Plan (Adult)  Goal: *Acute Goals and Plan of Care (Insert Text)  Description: Initial Occupational Therapy Goals (06/27/2021) Within 7 day(s):    1. Patient will perform grooming standing sinkside with supervision for increased independence with ADLs.  2. Patient will perform LB dressing with supervision & A/E PRN for increased independence with ADLs.  3. Patient will perform toilet transfer with supervision for increased independence with ADLs.  4. Patient will perform all aspects of toileting with supervision for increased independence with ADLs.  5. Patient will independently apply energy conservation techniques with 1 verbal cue(s)for increased independence with ADLs.  6. Patient will perform bathroom mobility with supervision for increased independence/safety with ADLs.    Outcome: Progressing Towards Goal  OCCUPATIONAL THERAPY EVALUATION    Patient: Jesus Arellano (85 y.o. male)  Date: 06/27/2021  Primary Diagnosis: Osteoarthritis of right hip [M16.11]  Procedure(s) (LRB):  RIGHT TOTAL HIP  ANTERIOR APPROACH WITH C-ARM (Right) Day of Surgery   Precautions: Fall, WBAT  PLOF: pt mod I for ADLs/functional mobility    ASSESSMENT AND RECOMMENDATIONS:  Based on the objective data described below, the patient presents with RLE decreased ROM and strength affecting LE ADLs. Pt found seated in recliner chair, vitals assessed and WNL, pt reporting pain 0/10, agreeable to therapy. Educated pt on proper body mechanics for ADLs s/p THR. Pt completed upper body dressing with supervision. Pt able to thread B feet through underwear/pants without assist, and CGA when standing to pull up to waist. Pt required CGA for STS/bathroom mobility with vc for safe use of RW. Pt voided standing at toilet with SBA for clothing management. Pt ambulated back to recliner, ice applied to R hip. Friend present during session for education on home safety. Provided opportunity for pt to voice questions on ADL  performance when home, pt has no further concerns. Patient will benefit from skilled Occupational Therapy intervention to maximize safety/independence with ADLs at d/c.    Education: Reviewed home safety, body mechanics, importance of moving every hour to prevent joint stiffness, role of ice for edema/pain control, Rolling Walker management/safety, and adaptive dressing techniques with patient verbalizing  understanding at this time     Patient will benefit from skilled intervention to address the above impairments.  Patient's rehabilitation potential is considered to be Good  Factors which may influence rehabilitation potential include:   [x]              None noted  []              Mental ability/status  []              Medical condition  []              Home/family situation and support systems  []              Safety awareness  []              Pain tolerance/management  []              Other:        PLAN :  Recommendations and Planned Interventions:   [x]                Self Care Training                  [x]       Therapeutic Activities  [x]                Functional Mobility  Training   []       Cognitive Retraining  [x]                Therapeutic Exercises           []       Endurance Activities  [x]                Balance Training                    []       Neuromuscular Re-Education  []                Visual/Perceptual Training     [x]       Home Safety Training  [x]                Patient Education                   [x]       Family Training/Education  []                Other (comment):    Frequency/Duration: Patient will be followed by Occupational Therapy 1-2 times per day/4-7 days per week to address goals.  Discharge Recommendations: Home health with adult supervision at least 24 hours after d/c  Further Equipment Recommendations for Discharge: N/A    AMPAC: Based on an AM-PAC score of 19/24 and their current ADL deficits; it is recommended that the patient have 2-3 sessions per week of Occupational Therapy at d/c to  increase the patient's independence.      This AMPAC score should be considered in conjunction with interdisciplinary team recommendations to determine the most appropriate discharge setting. Patient's social support, diagnosis, medical stability, and prior level of function should also be taken into consideration.     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     Barriers to Learning/Limitations: yes;  physical, post-anesthesia  Compensate with: visual, verbal, tactile, kinesthetic cues/model    Home Situation/Prior Level of Function:   Home Situation  Home Environment: Private residence  # Steps to Enter: 0  One/Two Story Residence: One story  Living Alone: Yes  Support Systems: Friend/Neighbor  Patient Expects to be Discharged to:: Home with home health  Current DME Used/Available at Home: Cane, straight, Raised toilet seat, Shower chair, Tub transfer bench, Walker, rolling  Tub or Shower Type: Tub/Shower combination  []   Right hand dominant   []   Left hand dominant    Cognitive/Behavioral Status:  Neurologic State: Alert  Orientation Level: Oriented to person;Oriented to place;Oriented to situation  Cognition: Follows commands  Safety/Judgement: Awareness of environment    Skin: R hip incision w/ Mepilex   Edema: compression hose in place & applied ice     Coordination: BUE  Coordination: Within functional limits  Fine Motor Skills-Upper: Left Intact;Right Intact  Gross Motor Skills-Upper: Left Intact;Right Intact    Balance:  Sitting: Intact  Standing: Intact;With  support    Strength: BUE  Strength: Generally decreased, functional    Tone & Sensation:BUE  Tone: Normal  Sensation: Impaired (R hip)    Range of Motion: BUE  AROM: Generally decreased, functional  PROM: Generally decreased, functional    Functional Mobility and Transfers for ADLs:  Bed Mobility:  Scooting: Stand-by assistance  Transfers:  Sit to Stand: Contact guard assistance   Bathroom Mobility: Contact guard assistance;Stand-by assistance    ADL Assessment:  Feeding: Independent  Oral Facial Hygiene/Grooming: Stand-by assistance  Bathing: Minimum assistance  Upper Body Dressing: Supervision  Lower Body Dressing: Contact guard assistance  Toileting: Stand by assistance    ADL Intervention:  Upper Body Dressing Assistance  Dressing Assistance: Supervision  Pullover Shirt: Supervision    Lower Body Dressing Assistance  Dressing Assistance: Contact guard assistance  Underpants: Contact guard assistance  Pants With Button/Zipper: Contact guard assistance  Leg Crossed Method Used: No  Position Performed: Seated in chair  Cues: Verbal cues provided;Visual cues provided    Toileting  Toileting Assistance: Contact guard assistance  Bladder Hygiene: Stand-by assistance  Clothing Management: Contact guard assistance    Cognitive Retraining  Safety/Judgement: Awareness of environment    Pain:  Pain level pre-treatment: 0/10  Pain level post-treatment: 0/10  Pain Intervention(s): Rest, Ice, Repositioning   Response to intervention: Nurse notified, see doc flow     Activity Tolerance:   Fair. Patient able to stand ~5 minute(s). Patient able to complete ADLs with intermittent rest breaks. Patient limited by pain, strength, ROM. Patient unsteady.     Please refer to the flowsheet for vital signs taken during this treatment.  After treatment:   [x]   Patient left in no apparent distress sitting up in chair  []   Patient sitting on EOB  []   Patient left in no apparent distress in bed  [x]   Call bell left within reach  [x]   Nursing  notified  []   Caregiver present  [x]   Ice applied  []   SCD's on while back in bed  []  Bed alarm activated    COMMUNICATION/EDUCATION:   Communication/Collaboration:  [x]        Role of Occupational Therapy in the acute care setting.  [x]       Home safety education was provided and the patient/caregiver indicated understanding.  [x]       Patient/family have participated as able in goal setting and plan of care.  [x]       Patient/family agree to work toward stated goals and plan of care.  []       Patient understands intent and goals of therapy, but is neutral about his/her participation.  []       Patient is unable to participate in plan of care at this time.    Thank you for this referral.  Levon Hagedorn, OTR/L  Time Calculation: 23 mins    Eval Complexity: History: MEDIUM Complexity : Expanded review of history including physical, cognitive and psychosocial  history ;   Examination: LOW Complexity : 1-3 performance deficits relating to physical, cognitive , or psychosocial skils that result in activity limitations and / or participation restrictions ;   Decision Making:LOW Complexity : No comorbidities that affect functional and no verbal or physical assistance needed to complete eval tasks     [employer] Daily Activity Inpatient Short Form (6-Clicks)*    How much HELP from another person does the  patient currently need.    (If the patient hasn't done an activity recently, how much help from another person do you think he/she would need if he/she tried?)   Total (Total A or Dep)   A Lot  (Mod to Max A)   A Little (Sup or Min A)   None (Mod I to I)   Putting on and taking off regular lower body clothing?   []  1 []  2 [x]  3 []  4   2. Bathing (including washing, rinsing,      drying)?    []  1 []  2 [x]  3 []  4   3. Toileting, which includes using toilet, bedpan or urinal?   []  1 []  2 [x]  3 []  4   4. Putting on and taking off regular upper body clothing?   []  1 []  2 [x]  3 []  4   5. Taking care of personal  grooming such as brushing teeth?   []  1 []  2 [x]  3 []  4   6. Eating meals?   []  1 []  2 []  3 [x]  4     Based on an AM-PAC score of 19/24 and their current ADL deficits; it is recommended that the patient have 2-3 sessions per week of Occupational Therapy at d/c to increase the patient's independence.

## 2021-06-27 NOTE — Anesthesia Pre-Procedure Evaluation (Signed)
Relevant Problems   No relevant active problems       Anesthetic History   No history of anesthetic complications            Review of Systems / Medical History  Patient summary reviewed, nursing notes reviewed and pertinent labs reviewed    Pulmonary  Within defined limits                 Neuro/Psych   Within defined limits           Cardiovascular    Hypertension: well controlled            Pertinent negatives: No past MI, CAD, PAD, dysrhythmias, angina and CHF  Exercise tolerance: >4 METS     GI/Hepatic/Renal  Within defined limits              Endo/Other        Arthritis  Pertinent negatives: No diabetes, hypothyroidism and hyperthyroidism   Other Findings              Physical Exam    Airway  Mallampati: III  TM Distance: 4 - 6 cm  Neck ROM: decreased range of motion   Mouth opening: Normal     Cardiovascular  Regular rate and rhythm,  S1 and S2 normal,  no murmur, click, rub, or gallop             Dental    Dentition: Full lower dentures and Full upper dentures     Pulmonary  Breath sounds clear to auscultation               Abdominal  GI exam deferred       Other Findings            Anesthetic Plan    ASA: 2  Anesthesia type: spinal and MAC          Induction: Intravenous  Anesthetic plan and risks discussed with: Patient and Family

## 2021-06-27 NOTE — Interval H&P Note (Signed)
 TRANSFER - IN REPORT:    Verbal report received from [place], RN(name) on Jesus Arellano  being received from [facility]) for routine progression of care      Report consisted of patient's Situation, Background, Assessment and   Recommendations(SBAR).     Information from the following report(s) SBAR, Kardex, and MAR was reviewed with the receiving nurse.    Opportunity for questions and clarification was provided.      Assessment completed upon patient's arrival to unit and care assumed.

## 2021-06-27 NOTE — Interval H&P Note (Signed)
Reviewed PTA medication list with patient/caregiver and patient/caregiver denies any additional medications.     Patient admits to having a responsible adult care for them at home for at least 24 hours after surgery.    Patient encouraged to use gown warming system and informed that using said warming gown to regulate body temperature prior to a procedure has been shown to help reduce the risks of blood clots and infection.    Patient's pharmacy of choice verified and documented in PTA medication section.    Dual skin assessment & fall risk band verification completed with S.Willliams RN.

## 2021-06-28 DIAGNOSIS — M25551 Pain in right hip: Secondary | ICD-10-CM | POA: Diagnosis not present

## 2021-06-28 DIAGNOSIS — R262 Difficulty in walking, not elsewhere classified: Secondary | ICD-10-CM | POA: Diagnosis not present

## 2021-07-11 DIAGNOSIS — M25551 Pain in right hip: Secondary | ICD-10-CM | POA: Diagnosis not present

## 2021-07-18 DIAGNOSIS — L578 Other skin changes due to chronic exposure to nonionizing radiation: Secondary | ICD-10-CM | POA: Diagnosis not present

## 2021-07-18 DIAGNOSIS — L821 Other seborrheic keratosis: Secondary | ICD-10-CM | POA: Diagnosis not present

## 2021-07-18 DIAGNOSIS — D225 Melanocytic nevi of trunk: Secondary | ICD-10-CM | POA: Diagnosis not present

## 2021-07-18 DIAGNOSIS — Z23 Encounter for immunization: Secondary | ICD-10-CM | POA: Diagnosis not present

## 2021-07-18 DIAGNOSIS — L570 Actinic keratosis: Secondary | ICD-10-CM | POA: Diagnosis not present

## 2021-07-18 DIAGNOSIS — L814 Other melanin hyperpigmentation: Secondary | ICD-10-CM | POA: Diagnosis not present

## 2021-07-18 DIAGNOSIS — Z86006 Personal history of melanoma in-situ: Secondary | ICD-10-CM | POA: Diagnosis not present

## 2021-07-18 DIAGNOSIS — Z86018 Personal history of other benign neoplasm: Secondary | ICD-10-CM | POA: Diagnosis not present

## 2021-08-04 DIAGNOSIS — Z20822 Contact with and (suspected) exposure to covid-19: Secondary | ICD-10-CM | POA: Diagnosis not present

## 2021-08-12 DIAGNOSIS — Z20822 Contact with and (suspected) exposure to covid-19: Secondary | ICD-10-CM | POA: Diagnosis not present

## 2021-09-05 DIAGNOSIS — Z09 Encounter for follow-up examination after completed treatment for conditions other than malignant neoplasm: Secondary | ICD-10-CM | POA: Diagnosis not present

## 2021-09-15 DIAGNOSIS — E782 Mixed hyperlipidemia: Secondary | ICD-10-CM | POA: Diagnosis not present

## 2021-09-15 DIAGNOSIS — I10 Essential (primary) hypertension: Secondary | ICD-10-CM | POA: Diagnosis not present

## 2021-09-15 DIAGNOSIS — E039 Hypothyroidism, unspecified: Secondary | ICD-10-CM | POA: Diagnosis not present

## 2021-09-15 DIAGNOSIS — E559 Vitamin D deficiency, unspecified: Secondary | ICD-10-CM | POA: Diagnosis not present

## 2021-09-20 DIAGNOSIS — M1612 Unilateral primary osteoarthritis, left hip: Secondary | ICD-10-CM | POA: Diagnosis not present

## 2021-09-20 DIAGNOSIS — M1611 Unilateral primary osteoarthritis, right hip: Secondary | ICD-10-CM | POA: Diagnosis not present

## 2021-09-20 DIAGNOSIS — E559 Vitamin D deficiency, unspecified: Secondary | ICD-10-CM | POA: Diagnosis not present

## 2021-09-20 DIAGNOSIS — I10 Essential (primary) hypertension: Secondary | ICD-10-CM | POA: Diagnosis not present

## 2021-09-20 DIAGNOSIS — E782 Mixed hyperlipidemia: Secondary | ICD-10-CM | POA: Diagnosis not present

## 2021-09-23 DIAGNOSIS — Z20822 Contact with and (suspected) exposure to covid-19: Secondary | ICD-10-CM | POA: Diagnosis not present

## 2021-10-18 DIAGNOSIS — Z20828 Contact with and (suspected) exposure to other viral communicable diseases: Secondary | ICD-10-CM | POA: Diagnosis not present

## 2021-11-04 DIAGNOSIS — Z20822 Contact with and (suspected) exposure to covid-19: Secondary | ICD-10-CM | POA: Diagnosis not present

## 2021-11-08 DIAGNOSIS — Z20822 Contact with and (suspected) exposure to covid-19: Secondary | ICD-10-CM | POA: Diagnosis not present

## 2021-12-01 DIAGNOSIS — Z20822 Contact with and (suspected) exposure to covid-19: Secondary | ICD-10-CM | POA: Diagnosis not present

## 2021-12-09 DIAGNOSIS — Z23 Encounter for immunization: Secondary | ICD-10-CM | POA: Diagnosis not present

## 2021-12-15 DIAGNOSIS — Z20822 Contact with and (suspected) exposure to covid-19: Secondary | ICD-10-CM | POA: Diagnosis not present

## 2022-01-16 DIAGNOSIS — L821 Other seborrheic keratosis: Secondary | ICD-10-CM | POA: Diagnosis not present

## 2022-01-16 DIAGNOSIS — Z86018 Personal history of other benign neoplasm: Secondary | ICD-10-CM | POA: Diagnosis not present

## 2022-01-16 DIAGNOSIS — D225 Melanocytic nevi of trunk: Secondary | ICD-10-CM | POA: Diagnosis not present

## 2022-01-16 DIAGNOSIS — D485 Neoplasm of uncertain behavior of skin: Secondary | ICD-10-CM | POA: Diagnosis not present

## 2022-01-16 DIAGNOSIS — L578 Other skin changes due to chronic exposure to nonionizing radiation: Secondary | ICD-10-CM | POA: Diagnosis not present

## 2022-01-16 DIAGNOSIS — L570 Actinic keratosis: Secondary | ICD-10-CM | POA: Diagnosis not present

## 2022-01-16 DIAGNOSIS — Z86006 Personal history of melanoma in-situ: Secondary | ICD-10-CM | POA: Diagnosis not present

## 2022-01-16 DIAGNOSIS — L820 Inflamed seborrheic keratosis: Secondary | ICD-10-CM | POA: Diagnosis not present

## 2022-01-16 DIAGNOSIS — C4442 Squamous cell carcinoma of skin of scalp and neck: Secondary | ICD-10-CM | POA: Diagnosis not present

## 2022-01-16 DIAGNOSIS — L814 Other melanin hyperpigmentation: Secondary | ICD-10-CM | POA: Diagnosis not present

## 2022-03-01 DIAGNOSIS — C44329 Squamous cell carcinoma of skin of other parts of face: Secondary | ICD-10-CM | POA: Diagnosis not present

## 2022-07-10 DIAGNOSIS — H353131 Nonexudative age-related macular degeneration, bilateral, early dry stage: Secondary | ICD-10-CM | POA: Diagnosis not present

## 2022-07-10 DIAGNOSIS — H2513 Age-related nuclear cataract, bilateral: Secondary | ICD-10-CM | POA: Diagnosis not present

## 2022-07-10 DIAGNOSIS — H43813 Vitreous degeneration, bilateral: Secondary | ICD-10-CM | POA: Diagnosis not present

## 2022-07-10 DIAGNOSIS — H52203 Unspecified astigmatism, bilateral: Secondary | ICD-10-CM | POA: Diagnosis not present

## 2022-07-18 DIAGNOSIS — H353131 Nonexudative age-related macular degeneration, bilateral, early dry stage: Secondary | ICD-10-CM | POA: Diagnosis not present

## 2022-07-18 DIAGNOSIS — H2513 Age-related nuclear cataract, bilateral: Secondary | ICD-10-CM | POA: Diagnosis not present

## 2022-07-27 DIAGNOSIS — H269 Unspecified cataract: Secondary | ICD-10-CM | POA: Diagnosis not present

## 2022-07-27 DIAGNOSIS — H2512 Age-related nuclear cataract, left eye: Secondary | ICD-10-CM | POA: Diagnosis not present

## 2022-08-02 DIAGNOSIS — H2511 Age-related nuclear cataract, right eye: Secondary | ICD-10-CM | POA: Diagnosis not present

## 2022-08-10 DIAGNOSIS — H2511 Age-related nuclear cataract, right eye: Secondary | ICD-10-CM | POA: Diagnosis not present

## 2022-08-10 DIAGNOSIS — H269 Unspecified cataract: Secondary | ICD-10-CM | POA: Diagnosis not present

## 2022-09-05 DIAGNOSIS — Z6832 Body mass index (BMI) 32.0-32.9, adult: Secondary | ICD-10-CM | POA: Diagnosis not present

## 2022-09-05 DIAGNOSIS — Z23 Encounter for immunization: Secondary | ICD-10-CM | POA: Diagnosis not present

## 2022-09-05 DIAGNOSIS — I10 Essential (primary) hypertension: Secondary | ICD-10-CM | POA: Diagnosis not present

## 2022-09-05 DIAGNOSIS — E782 Mixed hyperlipidemia: Secondary | ICD-10-CM | POA: Diagnosis not present

## 2022-09-18 DIAGNOSIS — H353131 Nonexudative age-related macular degeneration, bilateral, early dry stage: Secondary | ICD-10-CM | POA: Diagnosis not present

## 2022-09-18 DIAGNOSIS — Z961 Presence of intraocular lens: Secondary | ICD-10-CM | POA: Diagnosis not present

## 2022-09-20 DIAGNOSIS — L821 Other seborrheic keratosis: Secondary | ICD-10-CM | POA: Diagnosis not present

## 2022-09-20 DIAGNOSIS — Z85828 Personal history of other malignant neoplasm of skin: Secondary | ICD-10-CM | POA: Diagnosis not present

## 2022-09-20 DIAGNOSIS — L905 Scar conditions and fibrosis of skin: Secondary | ICD-10-CM | POA: Diagnosis not present

## 2022-11-13 DIAGNOSIS — I10 Essential (primary) hypertension: Secondary | ICD-10-CM | POA: Diagnosis not present

## 2022-11-13 DIAGNOSIS — E782 Mixed hyperlipidemia: Secondary | ICD-10-CM | POA: Diagnosis not present

## 2022-11-13 DIAGNOSIS — Z0000 Encounter for general adult medical examination without abnormal findings: Secondary | ICD-10-CM | POA: Diagnosis not present

## 2023-01-17 DIAGNOSIS — H353131 Nonexudative age-related macular degeneration, bilateral, early dry stage: Secondary | ICD-10-CM | POA: Diagnosis not present

## 2023-04-30 DIAGNOSIS — Z23 Encounter for immunization: Secondary | ICD-10-CM | POA: Diagnosis not present

## 2023-05-15 DIAGNOSIS — E782 Mixed hyperlipidemia: Secondary | ICD-10-CM | POA: Diagnosis not present

## 2023-05-15 DIAGNOSIS — I10 Essential (primary) hypertension: Secondary | ICD-10-CM | POA: Diagnosis not present

## 2023-05-15 DIAGNOSIS — Z6832 Body mass index (BMI) 32.0-32.9, adult: Secondary | ICD-10-CM | POA: Diagnosis not present

## 2023-05-18 DIAGNOSIS — Z85828 Personal history of other malignant neoplasm of skin: Secondary | ICD-10-CM | POA: Diagnosis not present

## 2023-05-18 DIAGNOSIS — L821 Other seborrheic keratosis: Secondary | ICD-10-CM | POA: Diagnosis not present

## 2023-05-18 DIAGNOSIS — L720 Epidermal cyst: Secondary | ICD-10-CM | POA: Diagnosis not present

## 2023-05-18 DIAGNOSIS — L905 Scar conditions and fibrosis of skin: Secondary | ICD-10-CM | POA: Diagnosis not present

## 2023-07-24 DIAGNOSIS — H43813 Vitreous degeneration, bilateral: Secondary | ICD-10-CM | POA: Diagnosis not present

## 2023-07-24 DIAGNOSIS — H353131 Nonexudative age-related macular degeneration, bilateral, early dry stage: Secondary | ICD-10-CM | POA: Diagnosis not present
